# Patient Record
Sex: Male | Born: 1935 | Race: White | Hispanic: No | Smoking: Light tobacco smoker
Health system: Southern US, Community
[De-identification: ages and names within clinical notes are randomized; demographics above are authoritative.]

## PROBLEM LIST (undated history)

## (undated) DIAGNOSIS — I10 Essential (primary) hypertension: Secondary | ICD-10-CM

## (undated) DIAGNOSIS — R42 Dizziness and giddiness: Secondary | ICD-10-CM

## (undated) DIAGNOSIS — IMO0001 Reserved for inherently not codable concepts without codable children: Secondary | ICD-10-CM

## (undated) DIAGNOSIS — H409 Unspecified glaucoma: Secondary | ICD-10-CM

## (undated) DIAGNOSIS — I82409 Acute embolism and thrombosis of unspecified deep veins of unspecified lower extremity: Secondary | ICD-10-CM

## (undated) DIAGNOSIS — H9190 Unspecified hearing loss, unspecified ear: Secondary | ICD-10-CM

## (undated) DIAGNOSIS — J189 Pneumonia, unspecified organism: Secondary | ICD-10-CM

## (undated) DIAGNOSIS — C801 Malignant (primary) neoplasm, unspecified: Secondary | ICD-10-CM

## (undated) DIAGNOSIS — F329 Major depressive disorder, single episode, unspecified: Secondary | ICD-10-CM

## (undated) DIAGNOSIS — F32A Depression, unspecified: Secondary | ICD-10-CM

## (undated) DIAGNOSIS — M2550 Pain in unspecified joint: Secondary | ICD-10-CM

## (undated) DIAGNOSIS — E785 Hyperlipidemia, unspecified: Secondary | ICD-10-CM

## (undated) DIAGNOSIS — J42 Unspecified chronic bronchitis: Secondary | ICD-10-CM

## (undated) DIAGNOSIS — E119 Type 2 diabetes mellitus without complications: Secondary | ICD-10-CM

## (undated) DIAGNOSIS — M549 Dorsalgia, unspecified: Secondary | ICD-10-CM

## (undated) DIAGNOSIS — K219 Gastro-esophageal reflux disease without esophagitis: Secondary | ICD-10-CM

## (undated) DIAGNOSIS — M1990 Unspecified osteoarthritis, unspecified site: Secondary | ICD-10-CM

## (undated) DIAGNOSIS — J449 Chronic obstructive pulmonary disease, unspecified: Secondary | ICD-10-CM

## (undated) HISTORY — PX: OTHER SURGICAL HISTORY: SHX169

## (undated) HISTORY — PX: ANKLE SURGERY: SHX546

## (undated) HISTORY — PX: HEMORRHOID SURGERY: SHX153

## (undated) HISTORY — PX: EYE SURGERY: SHX253

## (undated) HISTORY — PX: INNER EAR SURGERY: SHX679

## (undated) HISTORY — PX: CATARACT EXTRACTION, BILATERAL: SHX1313

## (undated) HISTORY — PX: HERNIA REPAIR: SHX51

---

## 2001-04-15 ENCOUNTER — Encounter: Payer: Self-pay | Admitting: Otolaryngology

## 2001-04-15 ENCOUNTER — Encounter: Admission: RE | Admit: 2001-04-15 | Discharge: 2001-04-15 | Payer: Self-pay | Admitting: Otolaryngology

## 2001-06-27 ENCOUNTER — Encounter: Payer: Self-pay | Admitting: Internal Medicine

## 2001-06-27 ENCOUNTER — Encounter: Admission: RE | Admit: 2001-06-27 | Discharge: 2001-06-27 | Payer: Self-pay | Admitting: Internal Medicine

## 2003-04-13 ENCOUNTER — Encounter: Admission: RE | Admit: 2003-04-13 | Discharge: 2003-04-13 | Payer: Self-pay | Admitting: Otolaryngology

## 2003-10-04 ENCOUNTER — Ambulatory Visit (HOSPITAL_COMMUNITY): Admission: RE | Admit: 2003-10-04 | Discharge: 2003-10-04 | Payer: Self-pay | Admitting: Otolaryngology

## 2003-10-04 ENCOUNTER — Ambulatory Visit (HOSPITAL_BASED_OUTPATIENT_CLINIC_OR_DEPARTMENT_OTHER): Admission: RE | Admit: 2003-10-04 | Discharge: 2003-10-04 | Payer: Self-pay | Admitting: Otolaryngology

## 2003-10-04 ENCOUNTER — Encounter (INDEPENDENT_AMBULATORY_CARE_PROVIDER_SITE_OTHER): Payer: Self-pay | Admitting: Specialist

## 2004-11-08 ENCOUNTER — Ambulatory Visit (HOSPITAL_COMMUNITY): Admission: RE | Admit: 2004-11-08 | Discharge: 2004-11-08 | Payer: Self-pay | Admitting: Gastroenterology

## 2005-12-03 ENCOUNTER — Ambulatory Visit (HOSPITAL_COMMUNITY): Admission: RE | Admit: 2005-12-03 | Discharge: 2005-12-03 | Payer: Self-pay | Admitting: Urology

## 2005-12-06 ENCOUNTER — Ambulatory Visit: Admission: RE | Admit: 2005-12-06 | Discharge: 2005-12-30 | Payer: Self-pay | Admitting: Radiation Oncology

## 2006-02-08 ENCOUNTER — Ambulatory Visit: Admission: RE | Admit: 2006-02-08 | Discharge: 2006-05-09 | Payer: Self-pay | Admitting: Radiation Oncology

## 2006-03-18 ENCOUNTER — Encounter: Admission: RE | Admit: 2006-03-18 | Discharge: 2006-03-18 | Payer: Self-pay | Admitting: Urology

## 2006-03-25 ENCOUNTER — Ambulatory Visit (HOSPITAL_BASED_OUTPATIENT_CLINIC_OR_DEPARTMENT_OTHER): Admission: RE | Admit: 2006-03-25 | Discharge: 2006-03-25 | Payer: Self-pay | Admitting: Urology

## 2009-02-28 ENCOUNTER — Encounter: Admission: RE | Admit: 2009-02-28 | Discharge: 2009-02-28 | Payer: Self-pay | Admitting: Otolaryngology

## 2009-06-30 ENCOUNTER — Emergency Department (HOSPITAL_COMMUNITY): Admission: EM | Admit: 2009-06-30 | Discharge: 2009-07-01 | Payer: Self-pay | Admitting: Emergency Medicine

## 2010-07-02 LAB — POCT I-STAT, CHEM 8
BUN: 16 mg/dL (ref 6–23)
Calcium, Ion: 1.12 mmol/L (ref 1.12–1.32)
Creatinine, Ser: 1.1 mg/dL (ref 0.4–1.5)
Glucose, Bld: 573 mg/dL (ref 70–99)
Sodium: 131 mEq/L — ABNORMAL LOW (ref 135–145)

## 2010-07-02 LAB — GLUCOSE, CAPILLARY
Glucose-Capillary: 113 mg/dL — ABNORMAL HIGH (ref 70–99)
Glucose-Capillary: 116 mg/dL — ABNORMAL HIGH (ref 70–99)
Glucose-Capillary: 318 mg/dL — ABNORMAL HIGH (ref 70–99)
Glucose-Capillary: 426 mg/dL — ABNORMAL HIGH (ref 70–99)
Glucose-Capillary: 98 mg/dL (ref 70–99)
Glucose-Capillary: >600 mg/dL (ref 70–99)

## 2010-07-02 LAB — DIFFERENTIAL
Basophils Absolute: 0 10*3/uL (ref 0.0–0.1)
Basophils Relative: 0 % (ref 0–1)
Eosinophils Absolute: 0.1 10*3/uL (ref 0.0–0.7)
Lymphocytes Relative: 24 % (ref 12–46)
Lymphs Abs: 1.4 10*3/uL (ref 0.7–4.0)
Monocytes Relative: 7 % (ref 3–12)
Neutro Abs: 4.1 10*3/uL (ref 1.7–7.7)
Neutrophils Relative %: 68 % (ref 43–77)

## 2010-07-02 LAB — URINALYSIS, ROUTINE W REFLEX MICROSCOPIC
Hgb urine dipstick: NEGATIVE
Ketones, ur: 15 mg/dL — AB
Leukocytes, UA: NEGATIVE
Nitrite: NEGATIVE
Protein, ur: NEGATIVE mg/dL
Specific Gravity, Urine: 1.035 — ABNORMAL HIGH (ref 1.005–1.030)
pH: 6.5 (ref 5.0–8.0)

## 2010-07-02 LAB — POCT I-STAT 3, VENOUS BLOOD GAS (G3P V)
O2 Saturation: 99 %
pO2, Ven: 154 mmHg — ABNORMAL HIGH (ref 30.0–45.0)

## 2010-07-02 LAB — CBC
Hemoglobin: 14.7 g/dL (ref 13.0–17.0)
MCHC: 34.5 g/dL (ref 30.0–36.0)
MCV: 89.5 fL (ref 78.0–100.0)
Platelets: 141 10*3/uL — ABNORMAL LOW (ref 150–400)
RDW: 12.4 % (ref 11.5–15.5)

## 2010-07-02 LAB — BASIC METABOLIC PANEL
BUN: 15 mg/dL (ref 6–23)
CO2: 29 mEq/L (ref 19–32)
Chloride: 100 mEq/L (ref 96–112)
Creatinine, Ser: 0.69 mg/dL (ref 0.4–1.5)
Creatinine, Ser: 1.04 mg/dL (ref 0.4–1.5)
GFR calc non Af Amer: >60 mL/min (ref >60)
Glucose, Bld: 164 mg/dL — ABNORMAL HIGH (ref 70–99)
Potassium: 3.6 mEq/L (ref 3.5–5.1)
Sodium: 134 mEq/L — ABNORMAL LOW (ref 135–145)

## 2010-07-02 LAB — URINE MICROSCOPIC-ADD ON

## 2010-08-25 NOTE — Op Note (Signed)
NAMEDARRIL, PATRIARCA                          ACCOUNT NO.:  [id number]   MEDICAL RECORD NO.:  [id number]                   PATIENT TYPE:  AMB   LOCATION:  DSC                                  FACILITY:  [facility]   PHYSICIAN:  Karol T. Lazarus Salines, M.D.              DATE OF BIRTH:  1934/09/11   DATE OF PROCEDURE:  10/04/2003  DATE OF DISCHARGE:                                 OPERATIVE REPORT   PREOPERATIVE DIAGNOSIS:  Right attic and mastoid cholesteatoma.   POSTOPERATIVE DIAGNOSIS:  Right attic and mastoid cholesteatoma.   OPERATION PERFORMED:  Right tympanoplasty with mastoidectomy, canal wall up,  and PORP ossicular reconstruction.  Myringotomy with tube.   SURGEON:  Gloris Manchester. Lazarus Salines, M.D.   ANESTHESIA:  General orotracheal anesthesia.   ESTIMATED BLOOD LOSS:  Minimal.   COMPLICATIONS:  None.   OPERATIVE FINDINGS:  A bulky relatively cleanly dissecting cholesteatoma  with a large mouth in the posterior pars flaccida of the right tympanic  membrane filling the attic including erosion of the heads of the malleus and  incus and the short processes of the incus all absent.  Extending back  through the atticus into antrum of the mastoid.  Obstructive chronic  mastoiditis with sclerotic bone but a relatively moderate to large mastoid  system.  Otherwise intact pars tensa of the tympanic membrane and healthy  aerated middle ear mucosa.  Intact long process of the malleus and long  process of the incus.  A mobile stapes superstructure.  Chorda tympani  intact.   DESCRIPTION OF PROCEDURE:  With the patient in a comfortable supine  position, general orotracheal anesthesia was induced without difficulty.  At  an appropriate level, the table was turned 90 degrees, and the patient  placed in a slightly reversed Trendelenburg position.  1% Xylocaine with  1:100,000 epinephrine was infiltrated into the postauricular crease, into  the vascular strip from posteriorly and into the temporalis  fascia harvest  site for intraoperative hemostasis.  Several minutes were allowed for these  to take effect.  A sterile preparation and draping of the right ear was  accomplished.   Using a microscope and speculum, the right ear canal was cleaned and the  findings as above were identified.  Four quadrant injections of 2% Xylocaine  with 1:50,000 epinephrine were performed, 2 mL total again for  intraoperative hemostasis.  Attention was returned to the postauricular area  where a complete mastoidectomy type incision staying above the auricular  crease to avoid trauma from wearing glasses was performed and carried down  through skin and subcutaneous fat using the sharp knife on the skin and  cutting and coagulating cautery beneath the skin.  The temporalis fascia was  identified and cleaned on its lateral surface.  The inferior edge was  excised and the medial surface was cleaned.  A roughly 2 x 3 cm piece of  temporalis fascia was harvested, cleaned,  pressed and placed to dry.  Hemosasis was spontaneous.  At this point an incision was made through the  periosteum at the linea temporalis and down the mastoid tip.  The periosteum  was elevated in all directions with a Lepird elevator.   Returning to the ear canal, the findings were as described above.  6 and 12  o'clock incisions were made starting at the annulus and working directly  outward using a sickle knife and these were connected around the posterior  annulus and the atticotomy defect using a large Rosen round knife.  The flap  was window shaded upwards for a slight distance.  The annulus was elevated  and the middle ear space was entered in the posterior inferior quadrant and  this was dissected up towards the attic.  Upon identifying the mouth of the  cholesteatoma sac, Bellucci scissors were used to trim the tympanic membrane  from posteriorly up to the short process of the malleus.  The tympanic  membrane was elevated off the  short process and carried forward with the  Bellucci scissors to the anterior aspect of the mouth of the cholesteatoma.  Attention was now returned to the postauricular area once again.  The flaps  were elevated farther forward with the use of self-retaining retractors and  the dissection was carried down the ear canal where the vascular strip was  carried forward with a Perkins retractor.  The cotton ball was placed in the  medial canal to protect from bone dust.  Using a large thoroughly irrigated  cutting bur, the incisions in the mastoid bone were begun along the linea  temporalis, the posterior canal and the anticipated site of the sigmoid  sinus.  The dissection was carried down taking care to saucerize the mastoid  carefully along all three of these landmarks.  The sigmoid sinus was  identified translucently through the bone identifying the so-called hard  angle.  The dura was also identified but not opened.  The dissection was  carried down in stages to the mastoid tip along the posterior canal and down  and finally large air cells in the mastoid tip were identified and  communicated upward and the antrum was identified.  The cholesteatoma sac  basically filled the antrum.  The bone was quite dense and there were  numerous so-called chicken fat containing air cells.  Upon identifying the  cholesteatoma sac it was gradually dissected and the bone overlying was  further removed. This was done in an iterative fashion working down the  zygomatic root, taking care to maintain the integrity of the posterior canal  wall working forward around the sigmoid sinus and down around the dural  plate.  The sac was opened and decompressed leaving the matrix intact for  better dissection.  The dissection was carried forward up into the attic.  The body and short process of the incus were noted to be completely absent.  The long process of the incus was intact but eroded at its base.  The head of the  malleus was also eroded and basically absent.  The dissection was  carried forward along the attic. The remainder of the malleus head was  removed with a malleus nipper but the tensor tympani was allowed to remain  intact.   Working back to the external canal, a small amount of dissection with drill  and curets along the annulus allowed better access to the incudostapedial  joint.  Using a tab knife, and working anteriorly along  the direction of the  stapedius tendon, the joint was disarticulated.  At this point the incus  handle was removed separate from the main specimen.  Working back to the  attic from [employer], the cholesteatoma sac was dissected free of the  anterior attic and brought back down to the pars flaccida and dissected away  from the neck of the malleus.  The chorda tympani nerve was intact.  The  cholesteatoma was felt to have been completely evacuated.  At this point the  somewhat soft bone and several remaining so-called chicken fat cells of the  mastoid tip and along the sigmoid sinus were gently debrided.  The mastoid  cavity was thoroughly saucerized.  The region of the facial nerve had been  dissected carefully but the nerve was not at any point identified  translucent through the bone.  There was no evidence of a dehiscence of the  facial nerve in the horizontal section or the second genu nor any exposure  of the horizontal semicircular canal.  At this point the middle ear space  was intact.  The pars tensa was intact.  The malleus handle was intact and  remained attached by the tensor tympani tendon.  Given a relative competence  about removal of the cholesteatoma, it was felt appropriate to attempt a  reconstruction.   Before attempting reconstruction, an anterior inferior radial myringotomy  incision was sharply executed and a modified T-tube was placed without  difficulty.  The fascia was brought off the press and fashioned with a tong  to reach up  under the malleus long handle and apart to rotate around to  completely cover the pars flaccida defect.  This was easily inserted with a  notch for the chorda tympani and draped up across the posterior canal wall.  The drum remnant was laid on top of this.  Then the whole thing was elevated  forward for access to the stapes superstructure.  The superstructure was  quite mobile and I could not completely determine whether it had possibly  been fractured although not out of position.   A Xomed PORP prosthesis with a notch for the malleus handle and a Plastipore  shaft was chosen.  This was placed over the stapes superstructure and  observed to be too tall.  It was shortened approximately 2 mm and placed  once again.  It had a rather strong anterior angulation but did connect from  the malleus long handle to the stapes superstructure and it was allowed to  remain in this position.  It was supported with small bits of Ciprodex impregnated Gelfoam.  The fascia plus drum composite was laid back down into  the annulus and up the posterior canal wall.  There was no residual  perforation noted.   Note that before attempting the tympanoplasty and ossicular reconstruction,  the entire field was thoroughly irrigated of bone chips.  The cotton ball  which had been placed in the external canal was removed sometime prior.  At  this point hemostasis was observed.  Satisfactory reconstruction of the drum  and conducting mechanism was felt to have been accomplished and the  cholesteatoma was felt to have been completely resected.  The mastoid cavity  was suctioned free one more time of a small amount of blood.  The self-  retaining retractors were removed and the postauricular wound was closed  with interrupted buried 3-0 chromic sutures.  The posterior wound was  finished with benzoin and Steri-Strips.   Working  back down to the external canal using the microscope, the vascular  slip was laid back down  on top of the temporalis fascia graft.  The canal  was filled with Ciprodex impregnated Gelfoam.  A cotton ball was wedged  tightly into the external meatus and a second cotton ball was placed in the  conchal bowl.  A standard fluff and cling dressing was applied.  At this  point the procedure was completed.  The patient was returned to anesthesia,  awakened, extubated and transferred to recovery in stable condition.   COMMENT:  A 76 year old white male with a several year history of draining  ear and identification of attic cholesteatoma.  The patient was reluctant to  undergo surgery for probably two years but now has come to surgery, given  some recurrent infections and diminishing hearing.  Anticipate a routine  postoperative recovery with attention to ice, elevation, analgesia,  antibiosis.  Will remove the mastoid dressing in two days and will remove  the cotton ball and begin eardrops in one week.  Will caution him about  water exposure and nose blowing.  Given low anticipated risks of post  anesthetic or post surgical complications,  I feel an outpatient venue is  appropriate.                                               Gloris Manchester. Lazarus Salines, M.D.    KTW/MEDQ  D:  10/04/2003  T:  10/04/2003  Job:  [id number]   cc:   Theressa Millard, M.D.  [address] [place]  [place] [address]  Fax: [phone]

## 2010-08-25 NOTE — Op Note (Signed)
[name], [name]                ACCOUNT NO.:  [id number]   MEDICAL RECORD NO.:  [id number]          PATIENT TYPE:  AMB   LOCATION:  ENDO                         FACILITY[facility]  [facility]   PHYSICIAN:  Danise Edge, M.D.   DATE OF BIRTH:  01/19/1935   DATE OF PROCEDURE:  11/08/2004  DATE OF DISCHARGE:                                 OPERATIVE REPORT   PROCEDURE:  Colonoscopy and polypectomy.   REFERRING PHYSICIAN:  Dr. Theressa Millard.   INDICATIONS FOR PROCEDURE:  Thomas Mcconnell is a 76 year old male born  January 25, 1935. Thomas Mcconnell is scheduled to undergo his first screening  colonoscopy with polypectomy to prevent colon cancer.   ENDOSCOPIST:  Danise Edge, M.D.   PREMEDICATION:  Versed 5 mg, Demerol 60 mg.   DESCRIPTION OF PROCEDURE:  After obtaining informed consent, Thomas Mcconnell was  placed in the left lateral decubitus position. I administered intravenous  Demerol and intravenous Versed to achieve conscious sedation for the  procedure. The patient's blood pressure, oxygen saturation and cardiac  rhythm were monitored throughout the procedure and documented in the medical  record.   Anal inspection and digital rectal exam were normal. The prostate was  nonnodular. The Olympus adjustable pediatric colonoscope was introduced into  the rectum and easily advanced to the cecum. Colonic preparation for the  exam today was excellent.   RECTUM:  Normal. Retroflex view of the distal rectum normal.  SIGMOID COLON AND DESCENDING COLON:  From the distal sigmoid colon at 20 cm  from the anal verge, a 2 mm sessile polyp was removed with the  electrocautery snare. Pathology was not retrieved. Left colonic  diverticulosis present.  SPLENIC FLEXURE:  Normal.  TRANSVERSE COLON:  Normal.  HEPATIC FLEXURE:  Normal.  ASCENDING COLON:  Normal.  CECUM AND ILEOCECAL VALVE:  Normal.   ASSESSMENT:  1.  From the distal sigmoid colon at 20 cm from the anal  verge, a 2 mm sessile polyp was  removed with the electrocautery snare. No  pathology was retrieved.  1.  Left colonic diverticulosis.   RECOMMENDATIONS:  Thomas Mcconnell should undergo a repeat colonoscopy in five  years.       MJ/MEDQ  D:  11/08/2004  T:  11/08/2004  Job:  [id number]   cc:   Theressa Millard, M.D.  [address] [place]  [place] [address]  Fax: [phone]

## 2010-08-25 NOTE — Op Note (Signed)
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 Biopsy performed November 14, 2004 showed a  Gleason 6-7  adenocarcinoma involving 5-10% of the tissue on the left  side. PSA prior to biopsy 4.05. Given the wide variety of options,  watchful waiting, hormonal therapy, radiation therapy and surgical  prostatectomy, the patient elected radiation therapy which we felt was  an appropriate or acceptable choice given his age, 29 years, and the  relatively low volume of the cancer. Bone scan and CT scan showed no  obvious evidence of metastatic disease.  See notes from Chipper Herb,  MD.   POSTOPERATIVE DIAGNOSIS:  Same.   PROCEDURE:  Transperineal placement of I-125 radioactive seeds.   SURGEONS:  1. Boston Service, MD.  2. Maryln Gottron, MD.   ANESTHESIA:  General.   DRAINS:  18-French Foley.   SPECIMENS:  None.   COMPLICATIONS:  None obvious.   DESCRIPTION OF PROCEDURE:  The patient was prepped and draped in the  dorsal lithotomy position after institution of an adequate level of  general anesthesia. A transrectal ultrasound probe, rectal tube,  suprapubic fluoroscopy unit and indwelling Foley catheter were  positioned. Duplicate transverse and longitudinal scans of the prostate  were obtained in hopes of duplicating pictures obtained preoperatively.  Once appropriate imaging had been obtained, transverse and longitudinal  outlines of the prostate were established using the Nucletron  guidelines. The plan was then instituted, seeds passed at the grid  according to prearranged coordinates. Total  number of needles 24, total  number of seeds 54.  Activity per seed 27.918 mCi, __________  isotope I-  125 all seeds appeared to pass easily in good position both by  ultrasound and fluoroscopic guidance. Once all seeds had been placed,  the needles were withdrawn.  Appropriate suprapubic fluoroscopic images  were obtained.  Indwelling Foley catheter was removed.  Fiberoptic  cystoscopy showed a normal urethra and sphincter, mild hyperemia of the  prostatic mucosa but no evidence of perforation or protruding seeds.  Normal trigonal anatomy, clear efflux right and left orifice.  No  evidence of bleeding within the bladder.  No evidence of puncture or  seed. The bladder was filled to capacity,  cystoscope was withdrawn, 18-French Foley was inserted and left to  straight drain.  The patient was then returned to recovery in  satisfactory condition having tolerated his fiberoptic cystoscopy and  transperineal needle placement of I-125 seeds.           ______________________________  Boston Service, M.D.     RH/MEDQ  D:  03/25/2006  T:  03/25/2006  Job:  [id number]   cc:   Theressa Millard, M.D.  Fax: [phone]   Maryln Gottron,  M.D.  Fax: [phone]

## 2014-09-15 ENCOUNTER — Other Ambulatory Visit: Payer: Self-pay | Admitting: Gastroenterology

## 2014-12-15 ENCOUNTER — Other Ambulatory Visit: Payer: Self-pay | Admitting: Gastroenterology

## 2014-12-16 ENCOUNTER — Encounter (HOSPITAL_COMMUNITY): Payer: Self-pay | Admitting: *Deleted

## 2014-12-28 ENCOUNTER — Ambulatory Visit (HOSPITAL_COMMUNITY): Payer: Medicare Other | Admitting: Anesthesiology

## 2014-12-28 ENCOUNTER — Ambulatory Visit (HOSPITAL_COMMUNITY)
Admission: RE | Admit: 2014-12-28 | Discharge: 2014-12-28 | Disposition: A | Discharge status: Home / Self Care | Payer: Medicare Other | Source: Ambulatory Visit | Attending: Gastroenterology | Admitting: Gastroenterology

## 2014-12-28 ENCOUNTER — Encounter (HOSPITAL_COMMUNITY)
Admission: RE | Disposition: A | Discharge status: Home / Self Care | Payer: Self-pay | Source: Ambulatory Visit | Attending: Gastroenterology

## 2014-12-28 ENCOUNTER — Encounter (HOSPITAL_COMMUNITY): Payer: Self-pay | Admitting: *Deleted

## 2014-12-28 DIAGNOSIS — E119 Type 2 diabetes mellitus without complications: Secondary | ICD-10-CM | POA: Diagnosis not present

## 2014-12-28 DIAGNOSIS — K5730 Diverticulosis of large intestine without perforation or abscess without bleeding: Secondary | ICD-10-CM | POA: Diagnosis not present

## 2014-12-28 DIAGNOSIS — J449 Chronic obstructive pulmonary disease, unspecified: Secondary | ICD-10-CM | POA: Diagnosis not present

## 2014-12-28 DIAGNOSIS — I10 Essential (primary) hypertension: Secondary | ICD-10-CM | POA: Diagnosis not present

## 2014-12-28 DIAGNOSIS — Z86711 Personal history of pulmonary embolism: Secondary | ICD-10-CM | POA: Insufficient documentation

## 2014-12-28 DIAGNOSIS — F17200 Nicotine dependence, unspecified, uncomplicated: Secondary | ICD-10-CM | POA: Diagnosis not present

## 2014-12-28 DIAGNOSIS — Z1211 Encounter for screening for malignant neoplasm of colon: Secondary | ICD-10-CM | POA: Insufficient documentation

## 2014-12-28 DIAGNOSIS — D122 Benign neoplasm of ascending colon: Secondary | ICD-10-CM | POA: Insufficient documentation

## 2014-12-28 DIAGNOSIS — Z86010 Personal history of colonic polyps: Secondary | ICD-10-CM | POA: Insufficient documentation

## 2014-12-28 HISTORY — DX: Major depressive disorder, single episode, unspecified: F32.9

## 2014-12-28 HISTORY — DX: Malignant (primary) neoplasm, unspecified: C80.1

## 2014-12-28 HISTORY — DX: Gastro-esophageal reflux disease without esophagitis: K21.9

## 2014-12-28 HISTORY — DX: Type 2 diabetes mellitus without complications: E11.9

## 2014-12-28 HISTORY — DX: Reserved for inherently not codable concepts without codable children: IMO0001

## 2014-12-28 HISTORY — DX: Essential (primary) hypertension: I10

## 2014-12-28 HISTORY — DX: Unspecified hearing loss, unspecified ear: H91.90

## 2014-12-28 HISTORY — DX: Acute embolism and thrombosis of unspecified deep veins of unspecified lower extremity: I82.409

## 2014-12-28 HISTORY — DX: Chronic obstructive pulmonary disease, unspecified: J44.9

## 2014-12-28 HISTORY — DX: Depression, unspecified: F32.A

## 2014-12-28 HISTORY — PX: COLONOSCOPY WITH PROPOFOL: SHX5780

## 2014-12-28 HISTORY — DX: Unspecified glaucoma: H40.9

## 2014-12-28 LAB — GLUCOSE, CAPILLARY: GLUCOSE-CAPILLARY: 129 mg/dL — AB (ref 65–99)

## 2014-12-28 SURGERY — COLONOSCOPY WITH PROPOFOL
Anesthesia: Monitor Anesthesia Care

## 2014-12-28 MED ORDER — PROPOFOL 10 MG/ML IV BOLUS
INTRAVENOUS | Status: DC | PRN
  Administered 2014-12-28: 20 mg via INTRAVENOUS
  Administered 2014-12-28 (×3): 40 mg via INTRAVENOUS
  Administered 2014-12-28: 20 mg via INTRAVENOUS
  Administered 2014-12-28: 40 mg via INTRAVENOUS

## 2014-12-28 MED ORDER — SODIUM CHLORIDE 0.9 % IV SOLN: INTRAVENOUS | Status: DC

## 2014-12-28 MED ORDER — PROPOFOL 10 MG/ML IV BOLUS
INTRAVENOUS | Status: AC
  Filled 2014-12-28: qty 20

## 2014-12-28 MED ORDER — LACTATED RINGERS IV SOLN
INTRAVENOUS | Status: DC
  Administered 2014-12-28: 1000 mL via INTRAVENOUS

## 2014-12-28 SURGICAL SUPPLY — 21 items

## 2014-12-28 NOTE — Discharge Instructions (Signed)

## 2014-12-28 NOTE — Op Note (Signed)
Procedure: Surveillance colonoscopy. 01/17/2010 normal surveillance colonoscopy performed. In 2006, colonoscopy performed with removal of a small adenomatous colon polyp  Endoscopist: Earle Gell  Premedication: Propofol administered by anesthesia  Procedure: The patient was placed in the left lateral decubitus position. Anal inspection and digital rectal exam were normal. The patient has undergone prostate seed implants to treat prostate cancer. The Pentax pediatric colonoscope was introduced into the rectum and advanced to the cecum. A normal-appearing ileocecal valve and appendiceal orifice were identified. Colonic preparation for the exam today was satisfactory. Withdrawal time was 12 minutes  Rectum. Normal. Retroflexed view of the distal rectum was normal  Sigmoid colon and descending colon. Left colonic diverticulosis  Splenic flexure. Normal  Transverse colon. Normal  Hepatic flexure. Normal  Ascending colon. From the proximal ascending colon, a 5 mm sessile polyp was removed with the cold snare  Cecum and ileocecal valve. Normal.  Assessment: A small polyp was removed from the proximal ascending colon. Otherwise normal surveillance colonoscopy.  Recommendation: I do not recommend performing a repeat surveillance colonoscopy.

## 2014-12-28 NOTE — H&P (Signed)
  Procedure: Surveillance colonoscopy. 01/17/2010 normal surveillance colonoscopy performed. In 2006 colonoscopy performed with removal of a small adenomatous colon polyp  History: The patient is a 80 year old male born [birth year]. He is scheduled to undergo a surveillance colonoscopy today.  Past medical history: Knee surgery. Hernia repair surgery. Achilles tendon repair. Hemorrhoidectomy. Ear surgery for cholesteatoma. Laser surgery for glaucoma. Bilateral cataract surgery. Seed implant into the prostate for prostate cancer. Pulmonary embolism after Achilles tendon repair. Chronic obstructive pulmonary disease. Hypertension. Type 2 diabetes mellitus. Major depressive disorder.  Exam: The patient is alert and lying comfortably on the endoscopy stretcher. Abdomen is soft and nontender to palpation. Lungs are clear to auscultation. Cardiac exam reveals a regular rhythm.  Plan: Proceed with surveillance colonoscopy

## 2014-12-28 NOTE — Transfer of Care (Signed)
Immediate Anesthesia Transfer of Care Note  Patient: Thomas Mcconnell  Procedure(s) Performed: Procedure(s): COLONOSCOPY WITH PROPOFOL (N/A)  Patient Location: PACU and Endoscopy Unit  Anesthesia Type:MAC  Level of Consciousness: awake, oriented, patient cooperative, lethargic and responds to stimulation  Airway & Oxygen Therapy: Patient Spontanous Breathing and Patient connected to face mask oxygen  Post-op Assessment: Report given to RN, Post -op Vital signs reviewed and stable and Patient moving all extremities  Post vital signs: Reviewed and stable  Last Vitals:  Filed Vitals:   12/28/14 0841  BP: 158/79  Temp: 36.5 C  Resp: 13    Complications: No apparent anesthesia complications

## 2014-12-28 NOTE — Anesthesia Postprocedure Evaluation (Signed)
  Anesthesia Post-op Note  Patient: Thomas Mcconnell  Procedure(s) Performed: Procedure(s) (LRB): COLONOSCOPY WITH PROPOFOL (N/A)  Patient Location: PACU  Anesthesia Type: MAC  Level of Consciousness: awake and alert   Airway and Oxygen Therapy: Patient Spontanous Breathing  Post-op Pain: mild  Post-op Assessment: Post-op Vital signs reviewed, Patient's Cardiovascular Status Stable, Respiratory Function Stable, Patent Airway and No signs of Nausea or vomiting  Last Vitals:  Filed Vitals:   12/28/14 1020  BP: 137/57  Pulse: 52  Temp:   Resp: 14    Post-op Vital Signs: stable   Complications: No apparent anesthesia complications

## 2014-12-28 NOTE — Anesthesia Preprocedure Evaluation (Addendum)
Anesthesia Evaluation  Patient identified by MRN, date of birth, ID band Patient awake    Reviewed: Allergy & Precautions, H&P , NPO status , Patient's Chart, lab work & pertinent test results  Airway Mallampati: II  TM Distance: >3 FB Neck ROM: full    Dental  (+) Dental Advisory Given, Edentulous Upper, Edentulous Lower   Pulmonary COPD,  COPD inhaler, Current Smoker,    Pulmonary exam normal breath sounds clear to auscultation       Cardiovascular Exercise Tolerance: Good hypertension, Pt. on medications Normal cardiovascular exam Rhythm:regular Rate:Normal     Neuro/Psych negative neurological ROS  negative psych ROS   GI/Hepatic negative GI ROS, Neg liver ROS,   Endo/Other  diabetes, Well Controlled, Type 2, Oral Hypoglycemic Agents  Renal/GU negative Renal ROS  negative genitourinary   Musculoskeletal   Abdominal   Peds  Hematology negative hematology ROS (+)   Anesthesia Other Findings   Reproductive/Obstetrics negative OB ROS                            Anesthesia Physical Anesthesia Plan  ASA: III  Anesthesia Plan: MAC   Post-op Pain Management:    Induction:   Airway Management Planned:   Additional Equipment:   Intra-op Plan:   Post-operative Plan:   Informed Consent: I have reviewed the patients History and Physical, chart, labs and discussed the procedure including the risks, benefits and alternatives for the proposed anesthesia with the patient or authorized representative who has indicated his/her understanding and acceptance.   Dental Advisory Given  Plan Discussed with: CRNA and Surgeon  Anesthesia Plan Comments:         Anesthesia Quick Evaluation

## 2015-01-21 ENCOUNTER — Other Ambulatory Visit: Payer: Self-pay | Admitting: Internal Medicine

## 2015-01-21 DIAGNOSIS — R0989 Other specified symptoms and signs involving the circulatory and respiratory systems: Secondary | ICD-10-CM

## 2015-01-25 ENCOUNTER — Other Ambulatory Visit: Payer: Medicare Other

## 2015-01-27 ENCOUNTER — Ambulatory Visit
Admission: RE | Admit: 2015-01-27 | Discharge: 2015-01-27 | Disposition: A | Discharge status: Home / Self Care | Payer: Medicare Other | Source: Ambulatory Visit | Attending: Internal Medicine | Admitting: Internal Medicine

## 2015-01-27 DIAGNOSIS — R0989 Other specified symptoms and signs involving the circulatory and respiratory systems: Secondary | ICD-10-CM

## 2015-02-04 ENCOUNTER — Encounter: Payer: Self-pay | Admitting: Vascular Surgery

## 2015-02-04 ENCOUNTER — Other Ambulatory Visit: Payer: Self-pay | Admitting: *Deleted

## 2015-02-04 DIAGNOSIS — I739 Peripheral vascular disease, unspecified: Secondary | ICD-10-CM

## 2015-03-11 ENCOUNTER — Encounter: Payer: Self-pay | Admitting: Vascular Surgery

## 2015-03-16 ENCOUNTER — Encounter: Payer: Self-pay | Admitting: Vascular Surgery

## 2015-03-16 ENCOUNTER — Ambulatory Visit (HOSPITAL_COMMUNITY)
Admission: RE | Admit: 2015-03-16 | Discharge: 2015-03-16 | Disposition: A | Discharge status: Home / Self Care | Payer: Medicare Other | Source: Ambulatory Visit | Attending: Vascular Surgery | Admitting: Vascular Surgery

## 2015-03-16 ENCOUNTER — Encounter (HOSPITAL_COMMUNITY): Payer: Medicare Other

## 2015-03-16 ENCOUNTER — Ambulatory Visit (INDEPENDENT_AMBULATORY_CARE_PROVIDER_SITE_OTHER): Payer: Medicare Other | Admitting: Vascular Surgery

## 2015-03-16 VITALS — BP 117/72 | HR 69 | Temp 98.0°F | Resp 16 | Ht 71.0 in | Wt 198.0 lb

## 2015-03-16 DIAGNOSIS — I7409 Other arterial embolism and thrombosis of abdominal aorta: Secondary | ICD-10-CM

## 2015-03-16 DIAGNOSIS — I739 Peripheral vascular disease, unspecified: Secondary | ICD-10-CM

## 2015-03-16 NOTE — Progress Notes (Signed)
[facility] [place]  Patient name: Thomas Mcconnell MRN: [id number] DOB: 1935-02-17 Sex: male  REASON FOR CONSULT: Peripheral vascular disease. Referred by Dr. Kelton Pillar.  HPI: Thomas Mcconnell is a 80 y.o. male, who is referred for evaluation of iliac artery occlusive disease. He has had some pain in the lateral aspect of his right thigh and also the right leg to some extent over the last several months. This is brought on by ambulation and relieved with rest. He denies any history of rest pain or nonhealing ulcers. He also has some low back pain and the fact that the pain is in the lateral aspect of his thigh suggest this could potentially be referred pain from the back.  He underwent an arterial Doppler study which suggested iliac artery occlusive disease on the right and he was sent for vascular consultation.  His risk factors for peripheral vascular disease include diabetes, hypertension, hypercholesterolemia, a family history of premature cardiovascular disease, and tobacco use. He smokes 3 cigars a day.  Past Medical History  Diagnosis Date  . Hypertension   . DVT (deep venous thrombosis) ([place])     s/p ankle surgery  . Bronchitis   . Diabetes mellitus without complication ([place])     oral meds only  . Depression   . GERD (gastroesophageal reflux disease)   . Cancer [facility])     Prostate cancer"seed implant" 10 years ago  . Glaucoma   . COPD (chronic obstructive pulmonary disease) ([place])     noted CXR 2011 Epic  . Hearing impaired     hearing aids bilaterally    History reviewed. No pertinent family history. His brother had a heart attack at age 61.  SOCIAL HISTORY: Social History   Social History  . Marital Status: Widowed    Spouse Name: N/A  . Number of Children: N/A  . Years of Education: N/A   Occupational History  . Not on file.   Social History Main Topics  . Smoking status: Light Tobacco Smoker -- 40 years    Types: Cigars  . Smokeless  tobacco: Never Used     Comment: smokes 2 cigars per day  . Alcohol Use: No  . Drug Use: No  . Sexual Activity: Not on file   Other Topics Concern  . Not on file   Social History Narrative    No Known Allergies  Current Outpatient Prescriptions  Medication Sig Dispense Refill  . albuterol (PROVENTIL HFA;VENTOLIN HFA) 108 (90 BASE) MCG/ACT inhaler Inhale 2 puffs into the lungs daily.    Marland Kitchen aspirin EC 81 MG tablet Take 81 mg by mouth at bedtime.    . beclomethasone (QVAR) 80 MCG/ACT inhaler Inhale 1 puff into the lungs 2 (two) times daily.    . cholecalciferol (VITAMIN D) 1000 UNITS tablet Take 1,000 Units by mouth daily.    . Clobetasol Prop Emollient Base (CLOBETASOL PROPIONATE E) 0.05 % emollient cream Apply topically 2 (two) times daily.    Marland Kitchen doxepin (SINEQUAN) 100 MG capsule Take 100 mg by mouth at bedtime.    Marland Kitchen FLUoxetine (PROZAC) 20 MG capsule Take 20 mg by mouth every morning.    Marland Kitchen guaiFENesin (MUCINEX) 600 MG 12 hr tablet Take by mouth 2 (two) times daily.    . hydrochlorothiazide (HYDRODIURIL) 25 MG tablet Take 25 mg by mouth every morning.    Marland Kitchen lisinopril (PRINIVIL,ZESTRIL) 10 MG tablet Take 10 mg by mouth every morning.    . meloxicam (MOBIC) 7.5  MG tablet Take 7.5 mg by mouth daily.    . metFORMIN (GLUCOPHAGE-XR) 500 MG 24 hr tablet Take 500 mg by mouth 3 (three) times daily.     . simvastatin (ZOCOR) 10 MG tablet Take 10 mg by mouth daily.    . timolol (BETIMOL) 0.25 % ophthalmic solution Place 1-2 drops into the left eye 2 (two) times daily.    Marland Kitchen tiotropium (SPIRIVA) 18 MCG inhalation capsule Place 18 mcg into inhaler and inhale daily.    . Travoprost, BAK Free, (TRAVATAN) 0.004 % SOLN ophthalmic solution Place 1 drop into the left eye at bedtime.    Marland Kitchen omeprazole (PRILOSEC) 40 MG capsule Take 40 mg by mouth every morning.     No current facility-administered medications for this visit.    REVIEW OF SYSTEMS:  [X]  denotes positive finding, [ ]  denotes negative  finding Cardiac  Comments:  Chest pain or chest pressure:    Shortness of breath upon exertion:    Short of breath when lying flat:    Irregular heart rhythm:        Vascular    Pain in calf, thigh, or hip brought on by ambulation: X Right calf and thigh  Pain in feet at night that wakes you up from your sleep:     Blood clot in your veins:    Leg swelling:         Pulmonary    Oxygen at home:    Productive cough:     Wheezing:         Neurologic    Sudden weakness in arms or legs:     Sudden numbness in arms or legs:     Sudden onset of difficulty speaking or slurred speech:    Temporary loss of vision in one eye:     Problems with dizziness:         Gastrointestinal    Blood in stool:     Vomited blood:         Genitourinary    Burning when urinating:     Blood in urine:        Psychiatric    Major depression:         Hematologic    Bleeding problems:    Problems with blood clotting too easily:        Skin    Rashes or ulcers:        Constitutional    Fever or chills:      PHYSICAL EXAM: Filed Vitals:   03/16/15 1019  BP: 117/72  Pulse: 69  Temp: 98 F (36.7 C)  TempSrc: Oral  Resp: 16  Height: 5\' 11"  (1.803 m)  Weight: 198 lb (89.812 kg)  SpO2: 97%    GENERAL: The patient is a well-nourished male, in no acute distress. The vital signs are documented above. CARDIAC: There is a regular rate and rhythm.  VASCULAR: do not detect carotid bruits. He has palpable femoral pulses bilaterally. I cannot palpate popliteal or pedal pulses. He has mild bilateral lower extremity swelling. He has some large varicosities along the medial aspect of his right leg and also the medial aspect of his left thigh and leg. PULMONARY: There is good air exchange bilaterally without wheezing or rales. ABDOMEN: Soft and non-tender with normal pitched bowel sounds.  MUSCULOSKELETAL: There are no major deformities or cyanosis. NEUROLOGIC: No focal weakness or paresthesias are  detected. SKIN: There are no ulcers or rashes noted. PSYCHIATRIC: The patient has a normal  affect.  DATA:  I have reviewed the Doppler study that was done by [facility] imaging on 01/27/2015. This was a Doppler study with exercise. At rest, ABI in the right was 100% and ABI on the left was 81%. The ABI on the right dropped to 38% with exercise. The ABI on the left remains stable. This suggests occlusive disease in the right iliac artery.   I have independently interpreted his aortoiliac duplex scan that was done today in our office. He does have elevated velocities in the mid right common iliac artery with a peak systolic velocity of [id number] cm/s. This would suggest a less than 50% right iliac artery stenosis. There also a simple in a velocities in the proximal left external iliac artery. Again the velocities would suggest a less than 50% stenosis.  MEDICAL ISSUES:  BILATERAL ILIAC ARTERY OCCLUSIVE DISEASE: He has a less than 50% right common iliac artery stenosis, and a less than 50% left external iliac artery stenosis. His symptoms are tolerable. I do think that his thigh pain on the right could be partly related to his iliac artery occlusive disease. However some of the pain may be related to his back given that the pain is along the lateral aspect of his thigh. We have discussed the importance of tobacco cessation. I have encouraged him to stay as active as possible. If his symptoms worsen, I would be happy to see him back in any time. Nadara Mustard this point I would not recommend an aggressive approach given his age and well-tolerated symptoms.   Deitra Mayo [facility] [place]: [phone]

## 2015-05-25 DIAGNOSIS — I739 Peripheral vascular disease, unspecified: Secondary | ICD-10-CM | POA: Diagnosis not present

## 2015-05-25 DIAGNOSIS — E1151 Type 2 diabetes mellitus with diabetic peripheral angiopathy without gangrene: Secondary | ICD-10-CM | POA: Diagnosis not present

## 2015-05-25 DIAGNOSIS — I10 Essential (primary) hypertension: Secondary | ICD-10-CM | POA: Diagnosis not present

## 2015-05-25 DIAGNOSIS — Z7984 Long term (current) use of oral hypoglycemic drugs: Secondary | ICD-10-CM | POA: Diagnosis not present

## 2015-05-25 DIAGNOSIS — J441 Chronic obstructive pulmonary disease with (acute) exacerbation: Secondary | ICD-10-CM | POA: Diagnosis not present

## 2015-06-13 DIAGNOSIS — H7011 Chronic mastoiditis, right ear: Secondary | ICD-10-CM | POA: Diagnosis not present

## 2015-06-15 DIAGNOSIS — H401131 Primary open-angle glaucoma, bilateral, mild stage: Secondary | ICD-10-CM | POA: Diagnosis not present

## 2015-06-23 DIAGNOSIS — L84 Corns and callosities: Secondary | ICD-10-CM | POA: Diagnosis not present

## 2015-06-23 DIAGNOSIS — E1151 Type 2 diabetes mellitus with diabetic peripheral angiopathy without gangrene: Secondary | ICD-10-CM | POA: Diagnosis not present

## 2015-06-23 DIAGNOSIS — B351 Tinea unguium: Secondary | ICD-10-CM | POA: Diagnosis not present

## 2015-06-29 DIAGNOSIS — H7583 Other specified disorders of middle ear and mastoid in diseases classified elsewhere, bilateral: Secondary | ICD-10-CM | POA: Diagnosis not present

## 2015-06-29 DIAGNOSIS — H7101 Cholesteatoma of attic, right ear: Secondary | ICD-10-CM | POA: Diagnosis not present

## 2015-06-30 ENCOUNTER — Other Ambulatory Visit: Payer: Self-pay | Admitting: Otolaryngology

## 2015-06-30 DIAGNOSIS — H7101 Cholesteatoma of attic, right ear: Secondary | ICD-10-CM

## 2015-07-07 ENCOUNTER — Ambulatory Visit
Admission: RE | Admit: 2015-07-07 | Discharge: 2015-07-07 | Disposition: A | Discharge status: Home / Self Care | Payer: Medicare Other | Source: Ambulatory Visit | Attending: Otolaryngology | Admitting: Otolaryngology

## 2015-07-07 DIAGNOSIS — H7191 Unspecified cholesteatoma, right ear: Secondary | ICD-10-CM | POA: Diagnosis not present

## 2015-07-07 DIAGNOSIS — H7101 Cholesteatoma of attic, right ear: Secondary | ICD-10-CM

## 2015-07-14 DIAGNOSIS — H7583 Other specified disorders of middle ear and mastoid in diseases classified elsewhere, bilateral: Secondary | ICD-10-CM | POA: Diagnosis not present

## 2015-07-14 DIAGNOSIS — H7193 Unspecified cholesteatoma, bilateral: Secondary | ICD-10-CM | POA: Diagnosis not present

## 2015-07-19 DIAGNOSIS — E119 Type 2 diabetes mellitus without complications: Secondary | ICD-10-CM | POA: Diagnosis not present

## 2015-07-20 ENCOUNTER — Telehealth: Payer: Self-pay | Admitting: Internal Medicine

## 2015-07-20 NOTE — Telephone Encounter (Signed)
Called and spoke with Thomas Mcconnell at Dr. Janeice Robinson office, scheduled patient to see Dr. Melvyn Novas tomorrow at 9:45am.   Thomas Mcconnell stated that she would contact patient to advise him of appointment.  Advised her to have patient arrive 15 min early to complete paperwork.  Nothing further needed.

## 2015-07-21 ENCOUNTER — Ambulatory Visit: Payer: Medicare Other | Admitting: Internal Medicine

## 2015-07-21 ENCOUNTER — Ambulatory Visit (INDEPENDENT_AMBULATORY_CARE_PROVIDER_SITE_OTHER)
Admission: RE | Admit: 2015-07-21 | Discharge: 2015-07-21 | Disposition: A | Discharge status: Home / Self Care | Payer: Medicare Other | Source: Ambulatory Visit | Attending: Internal Medicine | Admitting: Internal Medicine

## 2015-07-21 ENCOUNTER — Telehealth: Payer: Self-pay | Admitting: Internal Medicine

## 2015-07-21 ENCOUNTER — Ambulatory Visit (INDEPENDENT_AMBULATORY_CARE_PROVIDER_SITE_OTHER): Payer: Medicare Other | Admitting: Internal Medicine

## 2015-07-21 ENCOUNTER — Encounter: Payer: Self-pay | Admitting: Internal Medicine

## 2015-07-21 ENCOUNTER — Other Ambulatory Visit: Payer: Medicare Other

## 2015-07-21 VITALS — BP 108/62 | HR 71 | Ht 71.0 in | Wt 200.0 lb

## 2015-07-21 DIAGNOSIS — R059 Cough, unspecified: Secondary | ICD-10-CM | POA: Insufficient documentation

## 2015-07-21 DIAGNOSIS — J449 Chronic obstructive pulmonary disease, unspecified: Secondary | ICD-10-CM

## 2015-07-21 DIAGNOSIS — I10 Essential (primary) hypertension: Secondary | ICD-10-CM | POA: Diagnosis not present

## 2015-07-21 DIAGNOSIS — R05 Cough: Secondary | ICD-10-CM | POA: Diagnosis not present

## 2015-07-21 MED ORDER — MOMETASONE FURO-FORMOTEROL FUM 100-5 MCG/ACT IN AERO: INHALATION_SPRAY | RESPIRATORY_TRACT | Status: DC

## 2015-07-21 MED ORDER — VALSARTAN 80 MG PO TABS: 80.0000 mg | ORAL_TABLET | Freq: Every day | ORAL | Status: AC

## 2015-07-21 NOTE — Telephone Encounter (Signed)
Notes Recorded by Tanda Rockers, MD on 07/21/2015 at 12:43 PM Call pt: Reviewed cxr and no acute change so no change in recommendations made at [facility] and spoke to pt. Informed him of the results and recs per MW. Pt verbalized understanding and denied any further questions or concerns at this time.

## 2015-07-21 NOTE — Progress Notes (Signed)
Quick Note:  Called and spoke to pt. Informed him of the results and recs per MW. Pt verbalized understanding and denied any further questions or concerns at this time.   ______ 

## 2015-07-21 NOTE — Progress Notes (Signed)
Subjective:    Patient ID: Thomas Mcconnell, male    DOB: 1934-10-14,   MRN: [id number]  HPI  22 yowm cigar smoker referred to pulmonary clinic 07/21/2015 by Dr Constance Holster for preop eval for ear surgery   07/21/2015 1st [place] office visit/ Deniro Laymon  maint rx ppi/ spiriva/qvar acei Chief Complaint  Patient presents with  . Pulmonary Consult    Referred by Dr. Constance Holster for pulmonary clearance for ear surgery. He states "I have bronchitis, but I am breathing okay". He c/o prod cough with min white sputum.  c/o cough/ congestion x 15 y worse in am / clears p one hour > No purulent sputum or mucus plugs   No better with saba Sense of choking on large pills "always" while on ACEi no better with ppi  Doe = MMRC1 = can walk nl pace, flat grade, can't hurry or go uphills or steps s sob    No obvious other patterns in day to day or daytime variabilty or assoc chronic cough or cp or chest tightness, subjective wheeze overt sinus or hb symptoms. No unusual exp hx or h/o childhood pna/ asthma or knowledge of premature birth.  Sleeping ok without nocturnal  or early am exacerbation  of respiratory  c/o's or need for noct saba. Also denies any obvious fluctuation of symptoms with weather or environmental changes or other aggravating or alleviating factors except as outlined above   Current Medications, Allergies, Complete Past Medical History, Past Surgical History, Family History, and Social History were reviewed in [employer] record.            Review of Systems  Constitutional: Negative for fever, chills, activity change, appetite change and unexpected weight change.  HENT: Positive for sore throat and trouble swallowing. Negative for congestion, dental problem, postnasal drip, rhinorrhea, sneezing and voice change.   Eyes: Negative for visual disturbance.  Respiratory: Positive for cough and shortness of breath. Negative for choking.   Cardiovascular: Negative for chest  pain and leg swelling.  Gastrointestinal: Negative for nausea, vomiting and abdominal pain.  Genitourinary: Negative for difficulty urinating.  Musculoskeletal: Positive for arthralgias.  Skin: Negative for rash.  Psychiatric/Behavioral: Negative for behavioral problems and confusion.       Objective:   Physical Exam   Amb mod hoarse wm nad with prominent upper airway wheeze   Wt Readings from Last 3 Encounters:  07/21/15 200 lb (90.719 kg)  03/16/15 198 lb (89.812 kg)  12/28/14 190 lb (86.183 kg)    Vital signs reviewed  HEENT: nl  turbinates, and oropharynx. Nl external ear canals without cough reflex- no upper teeth, two lower incisor remnants   NECK :  without JVD/Nodes/TM/ nl carotid upstrokes bilaterally   LUNGS: no acc muscle use,  Nl contour chest with prominent bilateral insp and exp rhonchi/ pseudowheezing   CV:  RRR  no s3 or murmur or increase in P2, no edema   ABD:  soft and nontender with nl inspiratory excursion in the supine position. No bruits or organomegaly, bowel sounds nl  MS:  Nl gait/ ext warm without deformities, calf tenderness, cyanosis or clubbing No obvious joint restrictions   SKIN: warm and dry without lesions    NEURO:  alert, approp, nl sensorium with  no motor deficits   CXR PA and Lateral:   07/21/2015 :    I personally reviewed images and agree with radiology impression as follows:    There is no active cardiopulmonary disease.  Assessment & Plan:

## 2015-07-21 NOTE — Progress Notes (Signed)
Quick Note:  lmtcb ______ 

## 2015-07-21 NOTE — Patient Instructions (Addendum)
Stop lisinopril and qvar and spiriva  Start diovan(valsartan) 80 mg daily   Plan A = Automatic = dulera 100 Take 2 puffs first thing in am and then another 2 puffs about 12 hours later.   Plan B = Backup Only use your albuterol as a rescue medication to be used if you can't catch your breath by resting or doing a relaxed purse lip breathing pattern.  - The less you use it, the better it will work when you need it. - Ok to use up to 2 puffs  every 4 hours if you must but call for appointment if use goes up over your usual need - Don't leave home without it !!  (think of it like the spare tire for your car)     The key is to stop smoking completely before smoking completely stops you - it's clearly not too late  Please remember to go to the   x-ray department downstairs for your tests - we will call you with the results when they are available.      Please schedule a follow up office visit in 2 weeks, sooner if needed

## 2015-07-24 DIAGNOSIS — I10 Essential (primary) hypertension: Secondary | ICD-10-CM | POA: Insufficient documentation

## 2015-07-24 NOTE — Assessment & Plan Note (Addendum)
-   Spirometry 07/21/2015  FEV1 2.35 (70%)  Ratio 63   DDX of  difficult airways management almost all start with A and  include Adherence, Ace Inhibitors, Acid Reflux, Active Sinus Disease, Alpha 1 Antitripsin deficiency, Anxiety masquerading as Airways dz,  ABPA,  Allergy(esp in young), Aspiration (esp in elderly), Adverse effects of meds,  Active smokers, A bunch of PE's (a small clot burden can't cause this syndrome unless there is already severe underlying pulm or vascular dz with poor reserve) plus two Bs  = Bronchiectasis and Beta blocker use..and one C= CHF  Adherence is always the initial "prime suspect" and is a multilayered concern that requires a "trust but verify" approach in every patient - starting with knowing how to use medications, especially inhalers, correctly, keeping up with refills and understanding the fundamental difference between maintenance and prns vs those medications only taken for a very short course and then stopped and not refilled.  - - The proper method of use, as well as anticipated side effects, of a metered-dose inhaler are discussed and demonstrated to the patient. Improved effectiveness after extensive coaching during this visit to a level of approximately 75 % from a baseline of 50 %   ? ACEi > top of the usual list of suspects given assoc dysphagia  ? Adverse effects of dpi > try off all dpi's> d/c spiriva   ? Acid (or non-acid) GERD > always difficult to exclude as up to 75% of pts in some series report no assoc GI/ Heartburn symptoms> rec continue max (24h)  acid suppression and diet restrictions/ reviewed     ? BB effect > note on timolol eyedrops but not really much asthmatic component so ok to leave on for now   I had an extended discussion with the patient reviewing all relevant studies completed to date and  lasting  35 minutes of a 60 minute visit    1) should wait until we have his problem sorted out before elective surgery > should be able to clear  in 2 weeks > return ov first   2) Each maintenance medication was reviewed in detail including most importantly the difference between maintenance and prns and under what circumstances the prns are to be triggered using an action plan format that is not reflected in the computer generated alphabetically organized AVS.    Please see instructions for details which were reviewed in writing and the patient given a copy highlighting the part that I personally wrote and discussed at today's ov.

## 2015-07-24 NOTE — Assessment & Plan Note (Signed)
In the best review of chronic cough to date ( NEJM 2016 375 [phone]) ,  ACEi are now felt to cause cough in up to  20% of pts which is a 4 fold increase from previous reports and does not include the variety of non-specific complaints we see in pulmonary clinic in pts on ACEi but previously attributed to another dx like  Copd/asthma and  include PNDS, throat and chest congestion, "bronchitis", unexplained dyspnea and noct "strangling" sensations, and hoarseness, but also  atypical /refractory GERD symptoms like dysphagia and "bad heartburn"   The only way I know  to prove this is not an "ACEi Case" is a trial off ACEi x a minimum of 6 weeks then regroup - in his case see back in 2 weeks on diovan 80 mg daily to see if we can clear him for surgery.

## 2015-07-24 NOTE — Assessment & Plan Note (Signed)
Classic Upper airway cough syndrome, so named because it's frequently impossible to sort out how much is  CR/sinusitis with freq throat clearing (which can be related to primary GERD)   vs  causing  secondary (" extra esophageal")  GERD from wide swings in gastric pressure that occur with throat clearing, often  promoting self use of mint and menthol lozenges that reduce the lower esophageal sphincter tone and exacerbate the problem further in a cyclical fashion.   These are the same pts (now being labeled as having "irritable larynx syndrome" by some cough centers) who not infrequently have a history of having failed to tolerate ace inhibitors,  dry powder inhalers(both of which apply here) or biphosphonates or report having atypical reflux symptoms that don't respond to standard doses of PPI , and are easily confused as having aecopd or asthma flares by even experienced allergists/ pulmonologists.   First try off acei then regroup in 2 weeks

## 2015-08-04 ENCOUNTER — Encounter: Payer: Self-pay | Admitting: Acute Care

## 2015-08-04 ENCOUNTER — Ambulatory Visit (INDEPENDENT_AMBULATORY_CARE_PROVIDER_SITE_OTHER): Payer: Medicare Other | Admitting: Acute Care

## 2015-08-04 ENCOUNTER — Telehealth: Payer: Self-pay | Admitting: Internal Medicine

## 2015-08-04 VITALS — BP 124/70 | HR 69 | Ht 71.0 in | Wt 199.0 lb

## 2015-08-04 DIAGNOSIS — R062 Wheezing: Secondary | ICD-10-CM | POA: Diagnosis not present

## 2015-08-04 DIAGNOSIS — R059 Cough, unspecified: Secondary | ICD-10-CM

## 2015-08-04 DIAGNOSIS — R05 Cough: Secondary | ICD-10-CM

## 2015-08-04 DIAGNOSIS — I10 Essential (primary) hypertension: Secondary | ICD-10-CM

## 2015-08-04 DIAGNOSIS — J449 Chronic obstructive pulmonary disease, unspecified: Secondary | ICD-10-CM

## 2015-08-04 MED ORDER — METHYLPREDNISOLONE ACETATE 80 MG/ML IJ SUSP
120.0000 mg | Freq: Once | INTRAMUSCULAR | Status: AC
  Administered 2015-08-04: 120 mg via INTRAMUSCULAR

## 2015-08-04 MED ORDER — MOMETASONE FURO-FORMOTEROL FUM 100-5 MCG/ACT IN AERO: INHALATION_SPRAY | RESPIRATORY_TRACT | Status: AC

## 2015-08-04 NOTE — Patient Instructions (Addendum)
It was nice to meet you today. We will send in a prescription for the [facility]. Use 2 puffs twice daily as your maintenance. Continue using that 2 puffs twice daily Continue using your Albuterol inhaler as you rescue for shortness of breath or wheezing every 6 hours as needed Continue your Valsartan for blood pressure as your blood pressure is well controlled today. We will clear you for surgery. Please try to quit smoking. Follow up with Dr. Melvyn Novas in 4 weeks Depo Medrol injection today for wheezing. Please contact office for sooner follow up if symptoms do not improve or worsen or seek emergency care

## 2015-08-04 NOTE — Telephone Encounter (Signed)
LMTC x 1  

## 2015-08-04 NOTE — Telephone Encounter (Signed)
Spoke with pt and clarified that he was wanting a refill on the Diovan. Per pt chart the rx was sent to [facility] on 07/21/15 with 11 refills. Advised pt to contact [facility] for refill. Pt states that he will call them. Nothing further needed.

## 2015-08-04 NOTE — Assessment & Plan Note (Signed)
Cough with improvement after D/C'ing ACEI. BP well controlled on Valsartan 80 mg daily Plan: Continue Valsartan 80 mg daily Follow up with Dr. Melvyn Novas in 1 month Please contact office for sooner follow up if symptoms do not improve or worsen or seek emergency care

## 2015-08-04 NOTE — Assessment & Plan Note (Signed)
ACEI D/C's 07/21/2015 Valsartan 80 mg daily substituted Blood Pressure well controlled today. Pt. States cough is better Plan: Continue Valsartan 80 mg daily Follow up with Dr. Melvyn Novas in 4 weeks

## 2015-08-04 NOTE — Progress Notes (Signed)
Subjective:    Patient ID: Thomas Mcconnell, male    DOB: December 20, 1934, 81 y.o.   MRN: [id number]  HPI 45 yowm cigar smoker referred to pulmonary clinic 07/21/2015 by Dr Constance Holster for preop eval for ear surgery   Tests: Spirometry 07/21/2015 FEV1 2.35 (70%) Ratio 63 COPD Gold II   07/21/2015: CXR No active cardiopulmonary disease   Recommendations per Dr. Melvyn Novas 07/21/2015: Trial off ACEI: Lisinopril d/c'd: Started on Diovan ( Valsartan) 80 mg daily. D/C Dry powder inhalers: Qvar and Spiriva D/C'd Start: Dulera 100 2 puffs q am, 2 puffs q pm. Albuterol as rescue Stop smoking Return in 2 weeks for follow up to assess better control of symptoms/ surgical clearance   08/04/2015 Follow Up Office Visit: Pt. Returns to the office today for follow up.His  cough is better. He states that he is  breathing better, but not great, states he has a rattle at times..He states he does not have shortness of breath. BP is well controlled on Valsartan 124/70 today,and he states that his mouth is no longer dry. Minimal cough with white secretions. Some wheezing noted.He denies chest pain, fever, hemoptysis, orthopnea, leg or calf pain.    Current outpatient prescriptions:  .  albuterol (PROVENTIL HFA;VENTOLIN HFA) 108 (90 BASE) MCG/ACT inhaler, Inhale 2 puffs into the lungs daily., Disp: , Rfl:  .  aspirin EC 81 MG tablet, Take 81 mg by mouth at bedtime., Disp: , Rfl:  .  cholecalciferol (VITAMIN D) 1000 UNITS tablet, Take 1,000 Units by mouth daily., Disp: , Rfl:  .  Clobetasol Prop Emollient Base (CLOBETASOL PROPIONATE E) 0.05 % emollient cream, Apply topically 2 (two) times daily., Disp: , Rfl:  .  doxepin (SINEQUAN) 100 MG capsule, Take 100 mg by mouth at bedtime., Disp: , Rfl:  .  FLUoxetine (PROZAC) 20 MG capsule, Take 20 mg by mouth every morning., Disp: , Rfl:  .  guaiFENesin (MUCINEX) 600 MG 12 hr tablet, Take by mouth 2 (two) times daily., Disp: , Rfl:  .  hydrochlorothiazide (HYDRODIURIL) 25 MG  tablet, Take 25 mg by mouth every morning., Disp: , Rfl:  .  meloxicam (MOBIC) 7.5 MG tablet, Take 7.5 mg by mouth daily., Disp: , Rfl:  .  metFORMIN (GLUCOPHAGE-XR) 500 MG 24 hr tablet, Take 500 mg by mouth 3 (three) times daily. , Disp: , Rfl:  .  mometasone-formoterol (DULERA) 100-5 MCG/ACT AERO, Take 2 puffs first thing in am and then another 2 puffs about 12 hours later., Disp: , Rfl: 0 .  omeprazole (PRILOSEC) 40 MG capsule, Take 40 mg by mouth every morning., Disp: , Rfl:  .  simvastatin (ZOCOR) 10 MG tablet, Take 10 mg by mouth daily., Disp: , Rfl:  .  timolol (BETIMOL) 0.25 % ophthalmic solution, Place 1-2 drops into the left eye 2 (two) times daily., Disp: , Rfl:  .  Travoprost, BAK Free, (TRAVATAN) 0.004 % SOLN ophthalmic solution, Place 1 drop into the left eye at bedtime., Disp: , Rfl:  .  valsartan (DIOVAN) 80 MG tablet, Take 1 tablet (80 mg total) by mouth daily., Disp: 30 tablet, Rfl: 11   Past Medical History  Diagnosis Date  . Hypertension   . DVT (deep venous thrombosis) ([place])     s/p ankle surgery  . Bronchitis   . Diabetes mellitus without complication ([place])     oral meds only  . Depression   . GERD (gastroesophageal reflux disease)   . Cancer [facility])     Prostate  cancer"seed implant" 10 years ago  . Glaucoma   . COPD (chronic obstructive pulmonary disease) ([place])     noted CXR 2011 Epic  . Hearing impaired     hearing aids bilaterally    No Known Allergies   Review of Systems  Constitutional:   No  weight loss, night sweats,  Fevers, chills, fatigue, or  lassitude.  HEENT:   No headaches,  Difficulty swallowing,  Tooth/dental problems, or  Sore throat,                No sneezing, itching, ear ache, nasal congestion, post nasal drip,   CV:  No chest pain,  Orthopnea, PND, swelling in lower extremities, anasarca, dizziness, palpitations, syncope.   GI  No heartburn, indigestion, abdominal pain, nausea, vomiting, diarrhea, change in bowel habits, loss of  appetite, bloody stools.   Resp: No shortness of breath with exertion or at rest.  + excess white mucus , + productive cough,  No non-productive cough,  No coughing up of blood.  No change in color of mucus.  + wheezing.  No chest wall deformity  Skin: no rash or lesions.  GU: no dysuria, change in color of urine, no urgency or frequency.  No flank pain, no hematuria   MS:  No joint pain or swelling.  No decreased range of motion.  No back pain.  Psych:  No change in mood or affect. No depression or anxiety.  No memory loss.      Objective:   Physical Exam BP 124/70 mmHg  Pulse 69  Ht 5\' 11"  (1.803 m)  Wt 199 lb (90.266 kg)  BMI 27.77 kg/m2  SpO2 95%  Physical Exam:  General- No distress,  A&Ox3, obese ENT: No sinus tenderness, TM clear, pale nasal mucosa, no oral exudate,no post nasal drip, no LAN Cardiac: S1, S2, regular rate and rhythm, no murmur Chest: + exp. wheeze/ rales/ dullness; no accessory muscle use, no nasal flaring, no sternal retractions Abd.: Soft Non-tender Ext: No clubbing cyanosis, edema Neuro:  normal strength Skin: No rashes, warm and dry Psych: normal mood and behavior  Magdalen Spatz, AGACNP-BC [facility]     Assessment & Plan:

## 2015-08-04 NOTE — Assessment & Plan Note (Addendum)
ACEI d/c'd 07/21/2015 for dysphagia/cough/ wheeze  Qvar and Spiriva D/C's 07/21/2015: ( eliminating DPI's) Started Dulera 100 2 puffs BID Pt. States breathing is better, but some wheezing noted Plan: DepoMedrol 120 mg today Continue Dulera Continue Valsartan Continue max acid suppression/ diet restrictions  MW addendum:  Chart and office note reviewed in detail along with available xrays/ labs > agree with a/p as outline  I have independently seen and examined this pt today  and although he's improved he still has some subjective wheeze that is mostly upper airway with relatively good pfts noted.  Since this is relatively minor surgery planned believe it's reasonable (unless worse between now and surgery) to proceed with surgery.  d    Follow up with Dr. Melvyn Novas in 1 month

## 2015-08-05 NOTE — Progress Notes (Signed)
Chart and office note reviewed in detail  And pt seen and examined - he still has a little wheezing off acei so rx with depomedrol 120  And cleared

## 2015-08-12 ENCOUNTER — Ambulatory Visit: Payer: Self-pay | Admitting: Otolaryngology

## 2015-08-12 NOTE — H&P (Signed)
  No new complaints. When he presses on his mastoid he gets vertiginous. On exam, there is a large cholesteatoma mass filling the ear canal and mastoid cavity. I was only able to remove small parts. I was unable to clean out the remainder. I reviewed the CT scan. There is erosion of the horizontal semicircular canal. The audiogram today reveals a severe to profound loss on the left and a complete profound loss on the right. There is clinical and radiographic evidence of otic capsule erosion. This is a dangerous situation and he is going to require a revision mastoidectomy. Since the hearing is completely tolerated were not worried about that. He may have acute vertigo postoperatively. We discussed that if we do not operate, he runs the risk of further injury or damage with further vertigo and potentially intracranial involvement. The surgery is intended to create a safe ear. We will go ahead and schedule him for this. He will need a pulmonary clearance.  Electronically signed by: Beckie Salts, MD 07/14/15 1044  Back to top of Progress Notes Newt Lukes Carrier, AuD CCC-A - 07/14/2015 9:00 AM EDT Audiometric Results: Results were obtained using headphones; DNT with inserts due to right ear drainage. Reliability was good. Results indicate moderate sloping to severe sensorineural hearing loss for the left ear and a profound mixed hearing loss for the right ear. Bone conduction is symmetric. Word recognition was fair for left ear and CNT right ear due to SRT at 110dB.  Recommendations: Recommend repeat testing in conjunction with otologic care.  Continue use of current amplification, which may need to be louder after everything has resolved

## 2015-08-13 NOTE — Addendum Note (Signed)
Addended by: Christinia Gully B on: 08/13/2015 06:50 AM   Modules accepted: Level of Service, SmartSet

## 2015-08-17 ENCOUNTER — Encounter (HOSPITAL_COMMUNITY): Payer: Self-pay

## 2015-08-17 ENCOUNTER — Encounter (HOSPITAL_COMMUNITY)
Admission: RE | Admit: 2015-08-17 | Discharge: 2015-08-17 | Disposition: A | Discharge status: Home / Self Care | Payer: Medicare Other | Source: Ambulatory Visit | Attending: Otolaryngology | Admitting: Otolaryngology

## 2015-08-17 DIAGNOSIS — J449 Chronic obstructive pulmonary disease, unspecified: Secondary | ICD-10-CM | POA: Diagnosis not present

## 2015-08-17 DIAGNOSIS — Z79899 Other long term (current) drug therapy: Secondary | ICD-10-CM | POA: Diagnosis not present

## 2015-08-17 DIAGNOSIS — H7191 Unspecified cholesteatoma, right ear: Secondary | ICD-10-CM | POA: Diagnosis not present

## 2015-08-17 DIAGNOSIS — I10 Essential (primary) hypertension: Secondary | ICD-10-CM | POA: Diagnosis not present

## 2015-08-17 DIAGNOSIS — E119 Type 2 diabetes mellitus without complications: Secondary | ICD-10-CM | POA: Diagnosis not present

## 2015-08-17 DIAGNOSIS — Z7984 Long term (current) use of oral hypoglycemic drugs: Secondary | ICD-10-CM | POA: Diagnosis not present

## 2015-08-17 HISTORY — DX: Unspecified chronic bronchitis: J42

## 2015-08-17 HISTORY — DX: Pain in unspecified joint: M25.50

## 2015-08-17 HISTORY — DX: Hyperlipidemia, unspecified: E78.5

## 2015-08-17 HISTORY — DX: Dizziness and giddiness: R42

## 2015-08-17 HISTORY — DX: Dorsalgia, unspecified: M54.9

## 2015-08-17 HISTORY — DX: Unspecified osteoarthritis, unspecified site: M19.90

## 2015-08-17 HISTORY — DX: Pneumonia, unspecified organism: J18.9

## 2015-08-17 HISTORY — DX: Reserved for inherently not codable concepts without codable children: IMO0001

## 2015-08-17 LAB — CBC
HCT: 40 % (ref 39.0–52.0)
Hemoglobin: 13.5 g/dL (ref 13.0–17.0)
MCH: 29.5 pg (ref 26.0–34.0)
MCHC: 33.8 g/dL (ref 30.0–36.0)
MCV: 87.5 fL (ref 78.0–100.0)
Platelets: 173 10*3/uL (ref 150–400)
RBC: 4.57 MIL/uL (ref 4.22–5.81)
RDW: 13 % (ref 11.5–15.5)
WBC: 7.1 10*3/uL (ref 4.0–10.5)

## 2015-08-17 LAB — BASIC METABOLIC PANEL
Anion gap: 9 (ref 5–15)
BUN: 16 mg/dL (ref 6–20)
CO2: 28 mmol/L (ref 22–32)
Calcium: 9.3 mg/dL (ref 8.9–10.3)
Chloride: 104 mmol/L (ref 101–111)
Creatinine, Ser: 1.37 mg/dL — ABNORMAL HIGH (ref 0.61–1.24)
GFR calc Af Amer: 55 mL/min — ABNORMAL LOW (ref >60)
GFR calc non Af Amer: 47 mL/min — ABNORMAL LOW (ref >60)
Glucose, Bld: 164 mg/dL — ABNORMAL HIGH (ref 65–99)
Potassium: 3.9 mmol/L (ref 3.5–5.1)
Sodium: 141 mmol/L (ref 135–145)

## 2015-08-17 LAB — GLUCOSE, CAPILLARY: GLUCOSE-CAPILLARY: 167 mg/dL — AB (ref 65–99)

## 2015-08-17 NOTE — Progress Notes (Addendum)
Anesthesia Chart Review:  Pt is a 81 year old male scheduled for revision of R mastoidectomy on 08/18/2015 with Dr. Constance Holster.   PCP is Dr. Herschell Dimes Shamleffer. Pulmonologist is Dr. Christinia Gully who has cleared pt for surgery.   PMH includes:  HTN, DM, hyperlipidemia, COPD, DVT, glaucoma, prostate cancer, GERD. Current smoker. BMI 27  Medications include: albuterol, ASA, hctz, metformin, dulera, prilosec, simvastatin, timolol, valsartan  Preoperative labs reviewed.  Glucose 164, hgbA1c pending.   Chest x-ray 07/21/15 reviewed. No active cardiopulmonary disease.   EKG 08/17/15: sinus rhythm with PACs.   Pt will need further assessment by assigned anesthesiologist DOS.   Willeen Cass, FNP-BC [facility]/Anesthesiology Phone: [phone] 08/17/2015 2:33 PM

## 2015-08-17 NOTE — Progress Notes (Signed)
Cardiologist denies  Medical Md is Dr.Shamlet with [facility]  Echo denies  Stress test done 16+ yrs ago  Heart cath denies  CXR in epic from 07-21-15  EKG denies in past 2 yrs

## 2015-08-17 NOTE — Pre-Procedure Instructions (Signed)
Thomas Mcconnell  08/17/2015      [id number] - [place], [place] [address] [address] Phone: [phone] Fax: [phone], [place] [address] Phone: [phone] Fax: [phone]    Your procedure is scheduled on Thurs, May 11 @ 9:15 AM  Report to [place] at 7:15 AM  Call this number if you have problems the morning of surgery:  [phone]   Remember:  Do not eat food or drink liquids after midnight.  Take these medicines the morning of surgery with A SIP OF WATER Albuterol<Bring Your Inhaler With You>,Prozac(Fluoxetine),Omeprazole(Prilosec),and Eye Drops               Stop taking your Aspirin and Mobic. No Goody's,BC's,Aleve,Advil,Motrin,Fish Oil,or any Herbal Medications.     How to Manage Your Diabetes Before and After Surgery  Why is it important to control my blood sugar before and after surgery? . Improving blood sugar levels before and after surgery helps healing and can limit problems. . A way of improving blood sugar control is eating a healthy diet by: o  Eating less sugar and carbohydrates o  Increasing activity/exercise o  Talking with your doctor about reaching your blood sugar goals . High blood sugars (greater than 180 mg/dL) can raise your risk of infections and slow your recovery, so you will need to focus on controlling your diabetes during the weeks before surgery. . Make sure that the doctor who takes care of your diabetes knows about your planned surgery including the date and location.  How do I manage my blood sugar before surgery? . Check your blood sugar at least 4 times a day, starting 2 days before surgery, to make sure that the level is not too high or low. o Check your blood sugar the morning of your surgery when you wake up and every 2 hours until you get to the Short Stay unit. . If your  blood sugar is less than 70 mg/dL, you will need to treat for low blood sugar: o Do not take insulin. o Treat a low blood sugar (less than 70 mg/dL) with  cup of clear juice (cranberry or apple), 4 glucose tablets, OR glucose gel. o Recheck blood sugar in 15 minutes after treatment (to make sure it is greater than 70 mg/dL). If your blood sugar is not greater than 70 mg/dL on recheck, call [phone] for further instructions. . Report your blood sugar to the short stay nurse when you get to Short Stay.  . If you are admitted to the hospital after surgery: o Your blood sugar will be checked by the staff and you will probably be given insulin after surgery (instead of oral diabetes medicines) to make sure you have good blood sugar levels. o The goal for blood sugar control after surgery is 80-180 mg/dL.              WHAT DO I DO ABOUT MY DIABETES MEDICATION?   Marland Kitchen Do not take oral diabetes medicines (pills) the morning of surgery.  .       .   . The day of surgery, do not take other diabetes injectables, including Byetta (exenatide), Bydureon (exenatide ER), Victoza (liraglutide), or Trulicity (dulaglutide).  . If your CBG is greater than 220 mg/dL, you may take  of your sliding scale (correction) dose of insulin.  Other Instructions:          Patient Signature:  Date:   Nurse Signature:  Date:   Reviewed and Endorsed by [facility] Patient Education Committee, August 2015   Do not wear jewelry, make-up or nail polish.  Do not wear lotions, powders, or perfumes.  You may wear deodorant.  Do not shave 48 hours prior to surgery.  Men may shave face and neck.  Do not bring valuables to the hospital.  [facility] is not responsible for any belongings or valuables.  Contacts, dentures or bridgework may not be worn into surgery.  Leave your suitcase in the car.  After surgery it may be brought to your room.  For patients admitted to the hospital, discharge time will  be determined by your treatment team.  Patients discharged the day of surgery will not be allowed to drive home.    Special instructions:  [place] - Preparing for Surgery  Before surgery, you can play an important role.  Because skin is not sterile, your skin needs to be as free of germs as possible.  You can reduce the number of germs on you skin by washing with CHG (chlorahexidine gluconate) soap before surgery.  CHG is an antiseptic cleaner which kills germs and bonds with the skin to continue killing germs even after washing.  Please DO NOT use if you have an allergy to CHG or antibacterial soaps.  If your skin becomes reddened/irritated stop using the CHG and inform your nurse when you arrive at Short Stay.  Do not shave (including legs and underarms) for at least 48 hours prior to the first CHG shower.  You may shave your face.  Please follow these instructions carefully:   1.  Shower with CHG Soap the night before surgery and the                                morning of Surgery.  2.  If you choose to wash your hair, wash your hair first as usual with your       normal shampoo.  3.  After you shampoo, rinse your hair and body thoroughly to remove the                      Shampoo.  4.  Use CHG as you would any other liquid soap.  You can apply chg directly       to the skin and wash gently with scrungie or a clean washcloth.  5.  Apply the CHG Soap to your body ONLY FROM THE NECK DOWN.        Do not use on open wounds or open sores.  Avoid contact with your eyes,       ears, mouth and genitals (private parts).  Wash genitals (private parts)       with your normal soap.  6.  Wash thoroughly, paying special attention to the area where your surgery        will be performed.  7.  Thoroughly rinse your body with warm water from the neck down.  8.  DO NOT shower/wash with your normal soap after using and rinsing off       the CHG Soap.  9.  Pat yourself dry with a clean towel.             10.  Wear clean pajamas.  11.  Place clean sheets on your bed the night of your first shower and do not        sleep with pets.  Day of Surgery  Do not apply any lotions/deoderants the morning of surgery.  Please wear clean clothes to the hospital/surgery center.    Please read over the following fact sheets that you were given. Pain Booklet, Coughing and Deep Breathing and Surgical Site Infection Prevention

## 2015-08-18 ENCOUNTER — Ambulatory Visit (HOSPITAL_COMMUNITY): Payer: Medicare Other | Admitting: Certified Registered Nurse Anesthetist

## 2015-08-18 ENCOUNTER — Ambulatory Visit (HOSPITAL_COMMUNITY): Payer: Medicare Other | Admitting: Emergency Medicine

## 2015-08-18 ENCOUNTER — Encounter (HOSPITAL_COMMUNITY): Payer: Self-pay | Admitting: Anesthesiology

## 2015-08-18 ENCOUNTER — Ambulatory Visit (HOSPITAL_COMMUNITY)
Admission: RE | Admit: 2015-08-18 | Discharge: 2015-08-18 | Disposition: A | Discharge status: Home / Self Care | Payer: Medicare Other | Source: Ambulatory Visit | Attending: Otolaryngology | Admitting: Otolaryngology

## 2015-08-18 ENCOUNTER — Encounter (HOSPITAL_COMMUNITY)
Admission: RE | Disposition: A | Discharge status: Home / Self Care | Payer: Self-pay | Source: Ambulatory Visit | Attending: Otolaryngology

## 2015-08-18 DIAGNOSIS — H7011 Chronic mastoiditis, right ear: Secondary | ICD-10-CM | POA: Diagnosis not present

## 2015-08-18 DIAGNOSIS — H7191 Unspecified cholesteatoma, right ear: Secondary | ICD-10-CM | POA: Insufficient documentation

## 2015-08-18 DIAGNOSIS — Z7984 Long term (current) use of oral hypoglycemic drugs: Secondary | ICD-10-CM | POA: Insufficient documentation

## 2015-08-18 DIAGNOSIS — J449 Chronic obstructive pulmonary disease, unspecified: Secondary | ICD-10-CM | POA: Insufficient documentation

## 2015-08-18 DIAGNOSIS — I10 Essential (primary) hypertension: Secondary | ICD-10-CM | POA: Diagnosis not present

## 2015-08-18 DIAGNOSIS — Z79899 Other long term (current) drug therapy: Secondary | ICD-10-CM | POA: Insufficient documentation

## 2015-08-18 DIAGNOSIS — E119 Type 2 diabetes mellitus without complications: Secondary | ICD-10-CM | POA: Diagnosis not present

## 2015-08-18 DIAGNOSIS — M549 Dorsalgia, unspecified: Secondary | ICD-10-CM | POA: Diagnosis not present

## 2015-08-18 DIAGNOSIS — H7101 Cholesteatoma of attic, right ear: Secondary | ICD-10-CM | POA: Diagnosis not present

## 2015-08-18 HISTORY — PX: MASTOIDECTOMY: SHX711

## 2015-08-18 LAB — HEMOGLOBIN A1C
HEMOGLOBIN A1C: 6.9 % — AB (ref 4.8–5.6)
MEAN PLASMA GLUCOSE: 151 mg/dL

## 2015-08-18 LAB — GLUCOSE, CAPILLARY
GLUCOSE-CAPILLARY: 93 mg/dL (ref 65–99)
GLUCOSE-CAPILLARY: 102 mg/dL — AB (ref 65–99)
Glucose-Capillary: 103 mg/dL — ABNORMAL HIGH (ref 65–99)

## 2015-08-18 SURGERY — MASTOIDECTOMY
Anesthesia: General | Site: Ear | Laterality: Right

## 2015-08-18 MED ORDER — LIDOCAINE-EPINEPHRINE 1 %-1:100000 IJ SOLN
INTRAMUSCULAR | Status: AC
  Filled 2015-08-18: qty 1

## 2015-08-18 MED ORDER — PROCHLORPERAZINE EDISYLATE 5 MG/ML IJ SOLN: 10.0000 mg | Freq: Once | INTRAMUSCULAR | Status: DC | PRN

## 2015-08-18 MED ORDER — CIPROFLOXACIN-DEXAMETHASONE 0.3-0.1 % OT SUSP
OTIC | Status: AC
  Filled 2015-08-18: qty 7.5

## 2015-08-18 MED ORDER — LIDOCAINE HCL (CARDIAC) 20 MG/ML IV SOLN
INTRAVENOUS | Status: DC | PRN
  Administered 2015-08-18: 120 mg via INTRAVENOUS

## 2015-08-18 MED ORDER — BACITRACIN ZINC 500 UNIT/GM EX OINT
TOPICAL_OINTMENT | CUTANEOUS | Status: DC | PRN
  Administered 2015-08-18: 1 via TOPICAL

## 2015-08-18 MED ORDER — FENTANYL CITRATE (PF) 250 MCG/5ML IJ SOLN
INTRAMUSCULAR | Status: AC
  Filled 2015-08-18: qty 5

## 2015-08-18 MED ORDER — FENTANYL CITRATE (PF) 100 MCG/2ML IJ SOLN
INTRAMUSCULAR | Status: DC | PRN
  Administered 2015-08-18: 100 ug via INTRAVENOUS
  Administered 2015-08-18: 50 ug via INTRAVENOUS

## 2015-08-18 MED ORDER — ONDANSETRON HCL 4 MG/2ML IJ SOLN
INTRAMUSCULAR | Status: DC | PRN
  Administered 2015-08-18: 4 mg via INTRAVENOUS

## 2015-08-18 MED ORDER — SUGAMMADEX SODIUM 200 MG/2ML IV SOLN
INTRAVENOUS | Status: DC | PRN
  Administered 2015-08-18: 200 mg via INTRAVENOUS

## 2015-08-18 MED ORDER — METHYLENE BLUE 0.5 % INJ SOLN
INTRAVENOUS | Status: DC | PRN
  Administered 2015-08-18: 10 mL

## 2015-08-18 MED ORDER — EPINEPHRINE HCL 1 MG/ML IJ SOLN
INTRAMUSCULAR | Status: DC | PRN
  Administered 2015-08-18: 1 mg

## 2015-08-18 MED ORDER — ROCURONIUM BROMIDE 100 MG/10ML IV SOLN
INTRAVENOUS | Status: DC | PRN
  Administered 2015-08-18: 50 mg via INTRAVENOUS

## 2015-08-18 MED ORDER — GELATIN ABSORBABLE 12-7 MM EX MISC
CUTANEOUS | Status: DC | PRN
  Administered 2015-08-18: 1 via TOPICAL

## 2015-08-18 MED ORDER — BACITRACIN ZINC 500 UNIT/GM EX OINT
TOPICAL_OINTMENT | CUTANEOUS | Status: AC
  Filled 2015-08-18: qty 28.35

## 2015-08-18 MED ORDER — HYDROMORPHONE HCL 1 MG/ML IJ SOLN: 0.2500 mg | INTRAMUSCULAR | Status: DC | PRN

## 2015-08-18 MED ORDER — CIPROFLOXACIN-DEXAMETHASONE 0.3-0.1 % OT SUSP
OTIC | Status: DC | PRN
  Administered 2015-08-18: 4 [drp] via OTIC

## 2015-08-18 MED ORDER — PROMETHAZINE HCL 25 MG RE SUPP: 25.0000 mg | Freq: Three times a day (TID) | RECTAL | Status: AC | PRN

## 2015-08-18 MED ORDER — LIDOCAINE-EPINEPHRINE 1 %-1:100000 IJ SOLN
INTRAMUSCULAR | Status: DC | PRN
  Administered 2015-08-18: 20 mL via INTRADERMAL

## 2015-08-18 MED ORDER — SODIUM CHLORIDE 0.9 % IR SOLN
Status: DC | PRN
  Administered 2015-08-18: 1000 mL

## 2015-08-18 MED ORDER — GLYCOPYRROLATE 0.2 MG/ML IJ SOLN
INTRAMUSCULAR | Status: DC | PRN
  Administered 2015-08-18: .4 mg via INTRAVENOUS

## 2015-08-18 MED ORDER — PROPOFOL 10 MG/ML IV BOLUS
INTRAVENOUS | Status: AC
  Filled 2015-08-18: qty 20

## 2015-08-18 MED ORDER — CIPROFLOXACIN-DEXAMETHASONE 0.3-0.1 % OT SUSP: 3.0000 [drp] | Freq: Three times a day (TID) | OTIC | Status: AC

## 2015-08-18 MED ORDER — EPINEPHRINE HCL 1 MG/ML IJ SOLN
INTRAMUSCULAR | Status: AC
  Filled 2015-08-18: qty 1

## 2015-08-18 MED ORDER — GELATIN ADSORBABLE OP FILM: ORAL_FILM | OPHTHALMIC | Status: DC | PRN

## 2015-08-18 MED ORDER — OXYCODONE HCL 5 MG PO TABS
ORAL_TABLET | ORAL | Status: DC
Start: 2015-08-18 — End: 2015-08-18
  Filled 2015-08-18: qty 1

## 2015-08-18 MED ORDER — EPHEDRINE SULFATE-NACL 50-0.9 MG/10ML-% IV SOSY
PREFILLED_SYRINGE | INTRAVENOUS | Status: DC | PRN
  Administered 2015-08-18 (×5): 10 mg via INTRAVENOUS

## 2015-08-18 MED ORDER — HYDROCODONE-ACETAMINOPHEN 7.5-325 MG PO TABS: 1.0000 | ORAL_TABLET | Freq: Four times a day (QID) | ORAL | Status: AC | PRN

## 2015-08-18 MED ORDER — PROPOFOL 10 MG/ML IV BOLUS
INTRAVENOUS | Status: DC | PRN
  Administered 2015-08-18: 100 mg via INTRAVENOUS

## 2015-08-18 MED ORDER — LACTATED RINGERS IV SOLN
INTRAVENOUS | Status: DC
Start: 2015-08-18 — End: 2015-08-18
  Administered 2015-08-18 (×2): via INTRAVENOUS

## 2015-08-18 MED ORDER — ONDANSETRON HCL 4 MG/2ML IJ SOLN
INTRAMUSCULAR | Status: AC
  Filled 2015-08-18: qty 2

## 2015-08-18 MED ORDER — METHYLENE BLUE 0.5 % INJ SOLN
INTRAVENOUS | Status: AC
  Filled 2015-08-18: qty 10

## 2015-08-18 MED ORDER — LIDOCAINE 2% (20 MG/ML) 5 ML SYRINGE
INTRAMUSCULAR | Status: AC
  Filled 2015-08-18: qty 5

## 2015-08-18 MED ORDER — ROCURONIUM BROMIDE 50 MG/5ML IV SOLN
INTRAVENOUS | Status: AC
  Filled 2015-08-18: qty 1

## 2015-08-18 MED ORDER — OXYCODONE HCL 5 MG PO TABS
5.0000 mg | ORAL_TABLET | Freq: Once | ORAL | Status: AC
  Administered 2015-08-18: 5 mg via ORAL

## 2015-08-18 SURGICAL SUPPLY — 69 items
BENZOIN TINCTURE PRP APPL 2/3 (GAUZE/BANDAGES/DRESSINGS) ×2 IMPLANT
BLADE EAR TYMPAN 2.5 60D BEAV (BLADE) IMPLANT
BLADE EAR TYMPAN 2.5 STR BEAV (BLADE) IMPLANT
BLADE EYE SICKLE 84 5 BEAV (BLADE) IMPLANT
BLADE NEEDLE 3 SS STRL (BLADE) IMPLANT
BLADE SURG 10 STRL SS (BLADE) ×2 IMPLANT
BLADE SURG 15 STRL LF DISP TIS (BLADE) ×1 IMPLANT
BLADE SURG 15 STRL SS (BLADE) ×1
BLADE SURG ROTATE 9660 (MISCELLANEOUS) IMPLANT
BNDG CONFORM 3 STRL LF (GAUZE/BANDAGES/DRESSINGS) IMPLANT
BNDG GAUZE ELAST 4 BULKY (GAUZE/BANDAGES/DRESSINGS) IMPLANT
BUR SABER RD CUTTING 3.0 (BURR) ×2 IMPLANT
CANISTER SUCTION 2500CC (MISCELLANEOUS) ×2 IMPLANT
CLEANER TIP ELECTROSURG 2X2 (MISCELLANEOUS) ×2 IMPLANT
CORDS BIPOLAR (ELECTRODE) IMPLANT
COTTONBALL LRG STERILE PKG (GAUZE/BANDAGES/DRESSINGS) ×2 IMPLANT
COVER SURGICAL LIGHT HANDLE (MISCELLANEOUS) ×2 IMPLANT
DECANTER SPIKE VIAL GLASS SM (MISCELLANEOUS) ×2 IMPLANT
DRAPE EENT ADH APERT 31X51 STR (DRAPES) ×2 IMPLANT
DRAPE INCISE 23X17 IOBAN STRL (DRAPES)
DRAPE INCISE IOBAN 23X17 STRL (DRAPES) IMPLANT
DRAPE MICROSCOPE LEICA 54X105 (DRAPE) ×2 IMPLANT
DRAPE PROXIMA HALF (DRAPES) ×2 IMPLANT
DRESSING ADAPTIC 1/2  N-ADH (PACKING) ×2 IMPLANT
DRSG GLASSCOCK MASTOID ADT (GAUZE/BANDAGES/DRESSINGS) ×2 IMPLANT
DRSG GLASSCOCK MASTOID PED (GAUZE/BANDAGES/DRESSINGS) IMPLANT
DRSG TELFA 3X8 NADH (GAUZE/BANDAGES/DRESSINGS) ×2 IMPLANT
ELECT COATED BLADE 2.86 ST (ELECTRODE) ×2 IMPLANT
ELECT REM PT RETURN 9FT ADLT (ELECTROSURGICAL) ×2
ELECTRODE REM PT RTRN 9FT ADLT (ELECTROSURGICAL) ×1 IMPLANT
GAUZE SPONGE 4X4 16PLY XRAY LF (GAUZE/BANDAGES/DRESSINGS) IMPLANT
GLOVE BIO SURGEON STRL SZ7.5 (GLOVE) ×2 IMPLANT
GLOVE BIOGEL PI IND STRL 7.0 (GLOVE) ×1 IMPLANT
GLOVE BIOGEL PI IND STRL 8.5 (GLOVE) ×1 IMPLANT
GLOVE BIOGEL PI INDICATOR 7.0 (GLOVE) ×1
GLOVE BIOGEL PI INDICATOR 8.5 (GLOVE) ×1
GLOVE ECLIPSE 7.5 STRL STRAW (GLOVE) ×2 IMPLANT
GLOVE SURG SS PI 7.0 STRL IVOR (GLOVE) ×2 IMPLANT
GOWN STRL REUS W/ TWL LRG LVL3 (GOWN DISPOSABLE) ×2 IMPLANT
GOWN STRL REUS W/ TWL XL LVL3 (GOWN DISPOSABLE) ×3 IMPLANT
GOWN STRL REUS W/TWL LRG LVL3 (GOWN DISPOSABLE) ×2
GOWN STRL REUS W/TWL XL LVL3 (GOWN DISPOSABLE) ×3
KIT BASIN OR (CUSTOM PROCEDURE TRAY) ×2 IMPLANT
KIT ROOM TURNOVER OR (KITS) ×2 IMPLANT
NEEDLE 18GX1X1/2 (RX/OR ONLY) (NEEDLE) ×2 IMPLANT
NEEDLE 27GAX1X1/2 (NEEDLE) ×2 IMPLANT
NS IRRIG 1000ML POUR BTL (IV SOLUTION) ×2 IMPLANT
PAD ARMBOARD 7.5X6 YLW CONV (MISCELLANEOUS) ×4 IMPLANT
PENCIL FOOT CONTROL (ELECTRODE) ×2 IMPLANT
PUNCH BIOPSY 4MM (MISCELLANEOUS)
PUNCH BIOPSY DERMAL 6MM STRL (MISCELLANEOUS) ×2 IMPLANT
PUNCH BIOPSY DISP 4 (MISCELLANEOUS) IMPLANT
SPONGE SURGIFOAM ABS GEL 12-7 (HEMOSTASIS) ×2 IMPLANT
STRIP CLOSURE SKIN 1/2X4 (GAUZE/BANDAGES/DRESSINGS) IMPLANT
STRIP CLOSURE SKIN 1/4X4 (GAUZE/BANDAGES/DRESSINGS) IMPLANT
SUT BONE WAX W31G (SUTURE) IMPLANT
SUT CHROMIC 3 0 PS 2 (SUTURE) ×2 IMPLANT
SUT ETHILON 5 0 P 3 18 (SUTURE)
SUT NYLON ETHILON 5-0 P-3 1X18 (SUTURE) IMPLANT
SUT PLAIN 5 0 P 3 18 (SUTURE) IMPLANT
SUT VIC AB 3-0 FS2 27 (SUTURE) ×2 IMPLANT
SYR BULB 3OZ (MISCELLANEOUS) IMPLANT
SYR CONTROL 10ML LL (SYRINGE) ×2 IMPLANT
TOWEL OR 17X24 6PK STRL BLUE (TOWEL DISPOSABLE) ×2 IMPLANT
TRAY ENT MC OR (CUSTOM PROCEDURE TRAY) ×2 IMPLANT
TRAY FOLEY CATH 14FRSI W/METER (CATHETERS) IMPLANT
TUBING EXTENTION W/L.L. (IV SETS) ×2 IMPLANT
TUBING IRRIGATION (MISCELLANEOUS) ×2 IMPLANT
WATER STERILE IRR 1000ML POUR (IV SOLUTION) ×2 IMPLANT

## 2015-08-18 NOTE — Transfer of Care (Signed)
Immediate Anesthesia Transfer of Care Note  Patient: Thomas Mcconnell  Procedure(s) Performed: Procedure(s): REVISION OF A RIGHT MASTOIDECTOMY (Right)  Patient Location: PACU  Anesthesia Type:General  Level of Consciousness: awake, alert  and patient cooperative  Airway & Oxygen Therapy: Patient Spontanous Breathing and Patient connected to nasal cannula oxygen  Post-op Assessment: Report given to RN and Post -op Vital signs reviewed and stable  Post vital signs: Reviewed and stable  Last Vitals:  Filed Vitals:   08/18/15 0727 08/18/15 1108  BP: 123/62   Pulse: 54   Temp: 36.6 C 36.2 C  Resp: 16     Last Pain: There were no vitals filed for this visit.       Complications: No apparent anesthesia complications

## 2015-08-18 NOTE — Discharge Instructions (Signed)
Leave ear dressing in place until Tuesday's appointment.

## 2015-08-18 NOTE — Anesthesia Preprocedure Evaluation (Addendum)
Anesthesia Evaluation  Patient identified by MRN, date of birth, ID band Patient awake    Reviewed: Allergy & Precautions, NPO status , Patient's Chart, lab work & pertinent test results  Airway Mallampati: II  TM Distance: >3 FB Neck ROM: Full    Dental  (+) Edentulous Upper, Edentulous Lower   Pulmonary shortness of breath, pneumonia, COPD, Current Smoker,    Pulmonary exam normal breath sounds clear to auscultation       Cardiovascular hypertension, Pt. on medications Normal cardiovascular exam Rhythm:Regular Rate:Normal     Neuro/Psych negative neurological ROS  negative psych ROS   GI/Hepatic Neg liver ROS, GERD  Medicated,  Endo/Other  diabetes, Type 2, Oral Hypoglycemic Agents  Renal/GU Cr 1.37 K 3.9  negative genitourinary   Musculoskeletal  (+) Arthritis ,   Abdominal   Peds negative pediatric ROS (+)  Hematology negative hematology ROS (+)   Anesthesia Other Findings   Reproductive/Obstetrics negative OB ROS                           Anesthesia Physical Anesthesia Plan  ASA: III  Anesthesia Plan: General   Post-op Pain Management:    Induction: Intravenous  Airway Management Planned: Oral ETT  Additional Equipment:   Intra-op Plan:   Post-operative Plan: Extubation in OR  Informed Consent: I have reviewed the patients History and Physical, chart, labs and discussed the procedure including the risks, benefits and alternatives for the proposed anesthesia with the patient or authorized representative who has indicated his/her understanding and acceptance.   Dental advisory given  Plan Discussed with: CRNA  Anesthesia Plan Comments:        Anesthesia Quick Evaluation

## 2015-08-18 NOTE — Anesthesia Procedure Notes (Signed)
Procedure Name: Intubation Date/Time: 08/18/2015 9:25 AM Performed by: Salli Quarry Wilmont Olund Pre-anesthesia Checklist: Patient identified, Emergency Drugs available, Suction available and Patient being monitored Patient Re-evaluated:Patient Re-evaluated prior to inductionOxygen Delivery Method: Circle System Utilized Preoxygenation: Pre-oxygenation with 100% oxygen Intubation Type: IV induction Ventilation: Mask ventilation without difficulty Laryngoscope Size: Mac and 3 Grade View: Grade I Tube type: Oral Tube size: 7.5 mm Number of attempts: 1 Airway Equipment and Method: Stylet Placement Confirmation: ETT inserted through vocal cords under direct vision,  positive ETCO2 and breath sounds checked- equal and bilateral Secured at: 22 cm Tube secured with: Tape Dental Injury: Teeth and Oropharynx as per pre-operative assessment

## 2015-08-18 NOTE — Op Note (Signed)
OPERATIVE REPORT  DATE OF SURGERY: 08/18/2015  PATIENT:  Thomas Mcconnell,  82 y.o. male  PRE-OPERATIVE DIAGNOSIS:  right cholesteatoma   POST-OPERATIVE DIAGNOSIS:  right cholesteatoma   PROCEDURE:  Procedure(s): REVISION OF A RIGHT MASTOIDECTOMY  SURGEON:  Beckie Salts, MD  ASSISTANTS: none  ANESTHESIA:   General   EBL:  40 ml  DRAINS: none  LOCAL MEDICATIONS USED:  1% Xylocaine with epinephrine  SPECIMEN:  Right mastoid contents  COUNTS:  Correct  PROCEDURE DETAILS: The patient was taken to the operating room and placed on the operating table in the supine position. Following induction of general endotracheal anesthesia, the right ear was prepped and draped in a standard fashion. The operating microscope was brought into the operative field. The ear canal was inspected and cleaned of debris. There was a large amount of cholesteatoma debris in the ear canal. This was suctioned out. A T-tube was in place in the anterior inferior tympanic membrane. Local anesthetic solution was infiltrated into the superior and inferior incisura. 15 scalpel was used to make incisions in these 2 places. A Lempert speculum was used to inspect the canal and the mastoid. Posterior canal skin was dissected off of the posterior canal wall bone. The posterior canal wall bone was intact except for the medial aspect just lateral to the facial ridge was completely eroded away and there is cholesteatoma seen extending back into the mastoid. A 4 mm high speed drill was used to remove the remainder of the posterior canal wall and to smooth out the edges. This exposed a large cholesteatoma which was gently dissected free from the mastoid cavity. I was able to visualize almost the entire mastoid cavity using microscope. A 30 nasal endoscope was then used to visualize as far posterior as necessary in order to visualize the entire mastoid cavity. The remainder of the mastoid cavity was clear and smooth and covered and  epithelium. There was no residual cholesteatoma matrix identified and no additional erosion. The vertical fascial canal was never exposed. The otic capsule was not exposed. The mastoid cavity was packed with Ciprodex soaked Gelfoam pieces. A large meatoplasty was accomplished by removing additional soft tissue posterior to the meatus and removing a large piece of posterior canal skin. 3-0 Vicryl sutures were used to tack down the skin on itself to prevent closure by healing. The cavity was then packed with 12 feet half-inch Adaptic packing coated with bacitracin ointment. A Glasscock dressing was applied. Patient was awakened exudate and transferred to recovery in stable condition.    PATIENT DISPOSITION:  To PACU, stable

## 2015-08-18 NOTE — Anesthesia Postprocedure Evaluation (Signed)
Anesthesia Post Note  Patient: Thomas Mcconnell  Procedure(s) Performed: Procedure(s) (LRB): REVISION OF A RIGHT MASTOIDECTOMY (Right)  Patient location during evaluation: PACU Anesthesia Type: General Level of consciousness: awake and alert Pain management: pain level controlled Vital Signs Assessment: post-procedure vital signs reviewed and stable Respiratory status: spontaneous breathing, nonlabored ventilation, respiratory function stable and patient connected to nasal cannula oxygen Cardiovascular status: blood pressure returned to baseline and stable Postop Assessment: no signs of nausea or vomiting Anesthetic complications: no    Last Vitals:  Filed Vitals:   08/18/15 1156 08/18/15 1208  BP:  149/81  Pulse: 70 77  Temp: 36.1 C   Resp: 17     Last Pain:  Filed Vitals:   08/18/15 1217  PainSc: 0-No pain                 [name]

## 2015-08-18 NOTE — H&P (View-Only) (Signed)
  No new complaints. When he presses on his mastoid he gets vertiginous. On exam, there is a large cholesteatoma mass filling the ear canal and mastoid cavity. I was only able to remove small parts. I was unable to clean out the remainder. I reviewed the CT scan. There is erosion of the horizontal semicircular canal. The audiogram today reveals a severe to profound loss on the left and a complete profound loss on the right. There is clinical and radiographic evidence of otic capsule erosion. This is a dangerous situation and he is going to require a revision mastoidectomy. Since the hearing is completely tolerated were not worried about that. He may have acute vertigo postoperatively. We discussed that if we do not operate, he runs the risk of further injury or damage with further vertigo and potentially intracranial involvement. The surgery is intended to create a safe ear. We will go ahead and schedule him for this. He will need a pulmonary clearance.  Electronically signed by: Beckie Salts, MD 07/14/15 1044  Back to top of Progress Notes Thomas Mcconnell, AuD CCC-A - 07/14/2015 9:00 AM EDT Audiometric Results: Results were obtained using headphones; DNT with inserts due to right ear drainage. Reliability was good. Results indicate moderate sloping to severe sensorineural hearing loss for the left ear and a profound mixed hearing loss for the right ear. Bone conduction is symmetric. Word recognition was fair for left ear and CNT right ear due to SRT at 110dB.  Recommendations: Recommend repeat testing in conjunction with otologic care.  Continue use of current amplification, which may need to be louder after everything has resolved

## 2015-08-18 NOTE — Interval H&P Note (Signed)
History and Physical Interval Note:  08/18/2015 8:38 AM  Thomas Mcconnell  has presented today for surgery, with the diagnosis of right cholesteatoma   The various methods of treatment have been discussed with the patient and family. After consideration of risks, benefits and other options for treatment, the patient has consented to  Procedure(s): REVISION OF A RIGHT MASTOIDECTOMY (Right) as a surgical intervention .  The patient's history has been reviewed, patient examined, no change in status, stable for surgery.  I have reviewed the patient's chart and labs.  Questions were answered to the patient's satisfaction.     Keiera Strathman

## 2015-08-19 ENCOUNTER — Encounter (HOSPITAL_COMMUNITY): Payer: Self-pay | Admitting: Otolaryngology

## 2015-09-01 DIAGNOSIS — E1151 Type 2 diabetes mellitus with diabetic peripheral angiopathy without gangrene: Secondary | ICD-10-CM | POA: Diagnosis not present

## 2015-09-01 DIAGNOSIS — B351 Tinea unguium: Secondary | ICD-10-CM | POA: Diagnosis not present

## 2015-09-01 DIAGNOSIS — L84 Corns and callosities: Secondary | ICD-10-CM | POA: Diagnosis not present

## 2015-09-06 ENCOUNTER — Encounter: Payer: Self-pay | Admitting: Internal Medicine

## 2015-09-06 ENCOUNTER — Ambulatory Visit (INDEPENDENT_AMBULATORY_CARE_PROVIDER_SITE_OTHER): Payer: Medicare Other | Admitting: Internal Medicine

## 2015-09-06 VITALS — BP 112/60 | HR 76 | Ht 71.0 in | Wt 196.0 lb

## 2015-09-06 DIAGNOSIS — I10 Essential (primary) hypertension: Secondary | ICD-10-CM | POA: Diagnosis not present

## 2015-09-06 DIAGNOSIS — J449 Chronic obstructive pulmonary disease, unspecified: Secondary | ICD-10-CM

## 2015-09-06 MED ORDER — FAMOTIDINE 20 MG PO TABS: ORAL_TABLET | ORAL | Status: AC

## 2015-09-06 NOTE — Progress Notes (Addendum)
Subjective:    Patient ID: Thomas Mcconnell, male    DOB: [birth year],   MRN: [id number]    Brief patient profile:  27 yowm cigar smoker referred to pulmonary clinic 07/21/2015 by Dr Constance Holster for preop eval for ear surgery   History of Present Illness  07/21/2015 1st [facility] office visit/ [name]  maint rx ppi/ spiriva/qvar acei Chief Complaint  Patient presents with  . Pulmonary Consult    Referred by Dr. Constance Holster for pulmonary clearance for ear surgery. He states "I have bronchitis, but I am breathing okay". He c/o prod cough with min white sputum.  c/o cough/ congestion x 15 y worse in am / clears p one hour > No purulent sputum or mucus plugs   No better with saba Sense of choking on large pills "always" while on ACEi no better with ppi  Doe = MMRC1 = can walk nl pace, flat grade, can't hurry or go uphills or steps s sob   rec Stop lisinopril and qvar and spiriva Start diovan(valsartan) 80 mg daily  Plan A = Automatic = dulera 100 Take 2 puffs first thing in am and then another 2 puffs about 12 hours later.  Plan B = Backup Only use your albuterol as a rescue medication The key is to stop smoking completely before smoking completely stops you - it's clearly not too late    NP eval 08/04/15 rec It was nice to meet you today. We will send in a prescription for the [facility]. Use 2 puffs twice daily as your maintenance. Continue using that 2 puffs twice daily Continue using your Albuterol inhaler as you rescue for shortness of breath or wheezing every 6 hours as needed Continue your Valsartan for blood pressure as your blood pressure is well controlled today.  Depo Medrol injection today for wheezing.     09/06/2015  f/u ov/[name] re:  GOLD II copd/ ? Asthma much better off acei while on dulera 100 2bid despite timolol eyedrops  Chief Complaint  Patient presents with  . Follow-up    Breathing is unchanged. He is still coughing with white sputum. He has had less wheezing. He uses  rescue inhaler once every 2 wks on average.   really Not limited by breathing from desired activities   Immediate "wheeze" /cough/ at hs better on side  No obvious day to day or daytime variability or assoc excess/ purulent sputum or mucus plugs   or cp or chest tightness, subjective wheeze or overt sinus or hb symptoms. No unusual exp hx or h/o childhood pna/ asthma or knowledge of premature birth.  Sleeping ok without nocturnal  or early am exacerbation  of respiratory  c/o's or need for noct saba. Also denies any obvious fluctuation of symptoms with weather or environmental changes or other aggravating or alleviating factors except as outlined above   Current Medications, Allergies, Complete Past Medical History, Past Surgical History, Family History, and Social History were reviewed in [employer] record.  ROS  The following are not active complaints unless bolded sore throat, dysphagia, dental problems, itching, sneezing,  nasal congestion or excess/ purulent secretions, ear ache,   fever, chills, sweats, unintended wt loss, classically pleuritic or exertional cp, hemoptysis,  orthopnea pnd or leg swelling, presyncope, palpitations, abdominal pain, anorexia, nausea, vomiting, diarrhea  or change in bowel or bladder habits, change in stools or urine, dysuria,hematuria,  rash, arthralgias, visual complaints, headache, numbness, weakness or ataxia or problems with walking or coordination,  change in mood/affect or memory.                Objective:   Physical Exam   Amb  Wm nad/ very hard of hearing   09/06/2015        196   07/21/15 200 lb (90.719 kg)  03/16/15 198 lb (89.812 kg)  12/28/14 190 lb (86.183 kg)    Vital signs reviewed  HEENT: nl  turbinates, and oropharynx. Nl external ear canals without cough reflex- no upper teeth, two lower incisor remnants   NECK :  without JVD/Nodes/TM/ nl carotid upstrokes bilaterally   LUNGS: no acc muscle use,  Nl  contour chest with pseudowheeze only / eliminated with plm   CV:  RRR  no s3 or murmur or increase in P2, no edema   ABD:  soft and nontender with nl inspiratory excursion in the supine position. No bruits or organomegaly, bowel sounds nl  MS:  Nl gait/ ext warm without deformities, calf tenderness, cyanosis or clubbing No obvious joint restrictions   SKIN: warm and dry without lesions    NEURO:  alert, approp, nl sensorium with  no motor deficits     CXR PA and Lateral:   07/21/2015 :    I personally reviewed images and agree with radiology impression as follows:    There is no active cardiopulmonary disease.         Assessment & Plan:   Outpatient Encounter Prescriptions as of 09/06/2015  Medication Sig  . albuterol (PROVENTIL HFA;VENTOLIN HFA) 108 (90 BASE) MCG/ACT inhaler Inhale 2 puffs into the lungs daily.  Marland Kitchen aspirin EC 81 MG tablet Take 81 mg by mouth at bedtime.  . cholecalciferol (VITAMIN D) 1000 UNITS tablet Take 1,000 Units by mouth daily.  . ciprofloxacin-dexamethasone (CIPRODEX) otic suspension Place 3 drops into the right ear 3 (three) times daily.  . Clobetasol Prop Emollient Base (CLOBETASOL PROPIONATE E) 0.05 % emollient cream Apply topically 2 (two) times daily.  Marland Kitchen doxepin (SINEQUAN) 100 MG capsule Take 100 mg by mouth at bedtime.  Marland Kitchen FLUoxetine (PROZAC) 20 MG capsule Take 20 mg by mouth every morning.  Marland Kitchen guaiFENesin (MUCINEX) 600 MG 12 hr tablet Take by mouth 2 (two) times daily.  . hydrochlorothiazide (HYDRODIURIL) 25 MG tablet Take 25 mg by mouth every morning.  Marland Kitchen HYDROcodone-acetaminophen (NORCO) 7.5-325 MG tablet Take 1 tablet by mouth every 6 (six) hours as needed for moderate pain.  . meloxicam (MOBIC) 7.5 MG tablet Take 7.5 mg by mouth daily.  . metFORMIN (GLUCOPHAGE-XR) 500 MG 24 hr tablet Take 500 mg by mouth 3 (three) times daily.   . mometasone-formoterol (DULERA) 100-5 MCG/ACT AERO Take 2 puffs first thing in am and then another 2 puffs about 12  hours later.  Marland Kitchen omeprazole (PRILOSEC) 40 MG capsule Take 40 mg by mouth every morning.  . promethazine (PHENERGAN) 25 MG suppository Place 1 suppository (25 mg total) rectally every 8 (eight) hours as needed for nausea or vomiting.  . simvastatin (ZOCOR) 10 MG tablet Take 10 mg by mouth daily.  . timolol (BETIMOL) 0.25 % ophthalmic solution Place 1-2 drops into the left eye 2 (two) times daily.  . Travoprost, BAK Free, (TRAVATAN) 0.004 % SOLN ophthalmic solution Place 1 drop into the left eye at bedtime.  . valsartan (DIOVAN) 80 MG tablet Take 1 tablet (80 mg total) by mouth daily.  . famotidine (PEPCID) 20 MG tablet One at bedtime   No facility-administered encounter medications on file as of  09/06/2015.    

## 2015-09-06 NOTE — Patient Instructions (Addendum)
Try pepcid ac 20 mg at bedtime - take it consistently for at least a month to see if it helps your nighttime wheezing   GERD (REFLUX)  is an extremely common cause of respiratory symptoms just like yours , many times with no obvious heartburn at all.    It can be treated with medication, but also with lifestyle changes including elevation of the head of your bed (ideally with 6 inch  bed blocks),  Smoking cessation, avoidance of late meals, excessive alcohol, and avoid fatty foods, chocolate, peppermint, colas, red wine, and acidic juices such as orange juice.  NO MINT OR MENTHOL PRODUCTS SO NO COUGH DROPS  USE SUGARLESS CANDY INSTEAD (Jolley ranchers or Stover's or Life Savers) or even ice chips will also do - the key is to swallow to prevent all throat clearing. NO OIL BASED VITAMINS - use powdered substitutes.     If you are satisfied with your treatment plan,  let your doctor know and he/she can either refill your medications or you can return here when your prescription runs out.     If in any way you are not 100% satisfied,  please tell us.  If 100% better, tell your friends!  Pulmonary follow up is as needed  Late add:  Advised to see opth  about changing out timolol if wheezing continues

## 2015-09-06 NOTE — Assessment & Plan Note (Signed)
Try off acei 07/21/2015 due to cough / pseudowheeze > 90% better 09/06/2015   Although even in retrospect it may not be clear the ACEi contributed to the pt's symptoms,  Pt improved off them and adding them back at this point or in the future would risk confusion in interpretation of non-specific respiratory symptoms to which this patient is prone  ie  Better not to muddy the waters here.   bp if fine on diovan 80 mg daily > no change rx > Follow up per Primary Care planned

## 2015-09-06 NOTE — Assessment & Plan Note (Addendum)
07/21/2015    try dulera 100 2bid as main complaint is cough/ not sob - Spirometry 07/21/2015  FEV1 2.35 (70%)  Ratio 63  - 09/06/2015  After extensive coaching HFA effectiveness =    90% > try dulera 100 2bid prn    I had an extended final summary discussion with the patient reviewing all relevant studies completed to date and  lasting 15 to 20 minutes of a 25 minute visit on the following issues:    He is much better off acei and only minimal pseudowheeze which is just as likely from gerd as from primary airways dz so rec max acid suppression/ gerd diet and off dulera 100 2 bid to see what difference if any this makes and  f/u with PC if better, here if not (next step would be a trial off timolol eyedrops in favor of a more selective agent like Betoptic but defer this to opth)   Each maintenance medication was reviewed in detail including most importantly the difference between maintenance and prns and under what circumstances the prns are to be triggered using an action plan format that is not reflected in the computer generated alphabetically organized AVS.    Please see instructions for details which were reviewed in writing and the patient given a copy highlighting the part that I personally wrote and discussed at today's ov.

## 2015-09-07 ENCOUNTER — Telehealth: Payer: Self-pay | Admitting: *Deleted

## 2015-09-07 NOTE — Telephone Encounter (Signed)
LMTCB

## 2015-09-07 NOTE — Telephone Encounter (Signed)
-----   Message from Tanda Rockers, MD sent at 09/07/2015  6:01 AM EDT ----- Need to consider changing eyedrops so ask him who his eye doctor is and send him a copy of last ov

## 2015-09-08 NOTE — Telephone Encounter (Signed)
[phone] Thomas Mcconnell calling back

## 2015-09-08 NOTE — Telephone Encounter (Signed)
Spoke with the pt and notified of recs per MW  He verbalized understanding His ophthalmologist is Dr Julian Reil I have sent him a copy of last ov

## 2015-11-02 DIAGNOSIS — E1151 Type 2 diabetes mellitus with diabetic peripheral angiopathy without gangrene: Secondary | ICD-10-CM | POA: Diagnosis not present

## 2015-11-02 DIAGNOSIS — I10 Essential (primary) hypertension: Secondary | ICD-10-CM | POA: Diagnosis not present

## 2015-11-02 DIAGNOSIS — Z7984 Long term (current) use of oral hypoglycemic drugs: Secondary | ICD-10-CM | POA: Diagnosis not present

## 2015-11-02 DIAGNOSIS — J449 Chronic obstructive pulmonary disease, unspecified: Secondary | ICD-10-CM | POA: Diagnosis not present

## 2015-11-02 DIAGNOSIS — I739 Peripheral vascular disease, unspecified: Secondary | ICD-10-CM | POA: Diagnosis not present

## 2015-11-02 DIAGNOSIS — Z6828 Body mass index (BMI) 28.0-28.9, adult: Secondary | ICD-10-CM | POA: Diagnosis not present

## 2015-11-02 DIAGNOSIS — E6609 Other obesity due to excess calories: Secondary | ICD-10-CM | POA: Diagnosis not present

## 2015-11-10 DIAGNOSIS — B351 Tinea unguium: Secondary | ICD-10-CM | POA: Diagnosis not present

## 2015-11-10 DIAGNOSIS — L84 Corns and callosities: Secondary | ICD-10-CM | POA: Diagnosis not present

## 2015-11-10 DIAGNOSIS — E1151 Type 2 diabetes mellitus with diabetic peripheral angiopathy without gangrene: Secondary | ICD-10-CM | POA: Diagnosis not present

## 2015-12-06 DIAGNOSIS — H7011 Chronic mastoiditis, right ear: Secondary | ICD-10-CM | POA: Diagnosis not present

## 2015-12-21 DIAGNOSIS — H26491 Other secondary cataract, right eye: Secondary | ICD-10-CM | POA: Diagnosis not present

## 2015-12-21 DIAGNOSIS — H401131 Primary open-angle glaucoma, bilateral, mild stage: Secondary | ICD-10-CM | POA: Diagnosis not present

## 2015-12-23 DIAGNOSIS — H7011 Chronic mastoiditis, right ear: Secondary | ICD-10-CM | POA: Diagnosis not present

## 2016-01-10 DIAGNOSIS — H7011 Chronic mastoiditis, right ear: Secondary | ICD-10-CM | POA: Diagnosis not present

## 2016-01-19 DIAGNOSIS — B351 Tinea unguium: Secondary | ICD-10-CM | POA: Diagnosis not present

## 2016-01-19 DIAGNOSIS — L84 Corns and callosities: Secondary | ICD-10-CM | POA: Diagnosis not present

## 2016-01-19 DIAGNOSIS — E1151 Type 2 diabetes mellitus with diabetic peripheral angiopathy without gangrene: Secondary | ICD-10-CM | POA: Diagnosis not present

## 2016-01-27 DIAGNOSIS — J441 Chronic obstructive pulmonary disease with (acute) exacerbation: Secondary | ICD-10-CM | POA: Diagnosis not present

## 2016-01-27 DIAGNOSIS — E1151 Type 2 diabetes mellitus with diabetic peripheral angiopathy without gangrene: Secondary | ICD-10-CM | POA: Diagnosis not present

## 2016-01-27 DIAGNOSIS — I10 Essential (primary) hypertension: Secondary | ICD-10-CM | POA: Diagnosis not present

## 2016-01-27 DIAGNOSIS — F339 Major depressive disorder, recurrent, unspecified: Secondary | ICD-10-CM | POA: Diagnosis not present

## 2016-01-27 DIAGNOSIS — Z125 Encounter for screening for malignant neoplasm of prostate: Secondary | ICD-10-CM | POA: Diagnosis not present

## 2016-01-27 DIAGNOSIS — E785 Hyperlipidemia, unspecified: Secondary | ICD-10-CM | POA: Diagnosis not present

## 2016-01-27 DIAGNOSIS — Z86010 Personal history of colonic polyps: Secondary | ICD-10-CM | POA: Diagnosis not present

## 2016-01-27 DIAGNOSIS — Z0000 Encounter for general adult medical examination without abnormal findings: Secondary | ICD-10-CM | POA: Diagnosis not present

## 2016-01-27 DIAGNOSIS — J449 Chronic obstructive pulmonary disease, unspecified: Secondary | ICD-10-CM | POA: Diagnosis not present

## 2016-01-27 DIAGNOSIS — K219 Gastro-esophageal reflux disease without esophagitis: Secondary | ICD-10-CM | POA: Diagnosis not present

## 2016-02-14 DIAGNOSIS — H7011 Chronic mastoiditis, right ear: Secondary | ICD-10-CM | POA: Diagnosis not present

## 2016-03-20 DIAGNOSIS — H6091 Unspecified otitis externa, right ear: Secondary | ICD-10-CM | POA: Diagnosis not present

## 2016-03-29 DIAGNOSIS — L84 Corns and callosities: Secondary | ICD-10-CM | POA: Diagnosis not present

## 2016-03-29 DIAGNOSIS — E1142 Type 2 diabetes mellitus with diabetic polyneuropathy: Secondary | ICD-10-CM | POA: Diagnosis not present

## 2016-03-29 DIAGNOSIS — B351 Tinea unguium: Secondary | ICD-10-CM | POA: Diagnosis not present

## 2016-04-24 DIAGNOSIS — H7011 Chronic mastoiditis, right ear: Secondary | ICD-10-CM | POA: Diagnosis not present

## 2016-06-12 DIAGNOSIS — H7011 Chronic mastoiditis, right ear: Secondary | ICD-10-CM | POA: Diagnosis not present

## 2016-06-21 DIAGNOSIS — L84 Corns and callosities: Secondary | ICD-10-CM | POA: Diagnosis not present

## 2016-06-21 DIAGNOSIS — B351 Tinea unguium: Secondary | ICD-10-CM | POA: Diagnosis not present

## 2016-06-21 DIAGNOSIS — E1142 Type 2 diabetes mellitus with diabetic polyneuropathy: Secondary | ICD-10-CM | POA: Diagnosis not present

## 2016-07-05 DIAGNOSIS — H401131 Primary open-angle glaucoma, bilateral, mild stage: Secondary | ICD-10-CM | POA: Diagnosis not present

## 2016-07-17 DIAGNOSIS — H7011 Chronic mastoiditis, right ear: Secondary | ICD-10-CM | POA: Diagnosis not present

## 2016-08-01 DIAGNOSIS — E785 Hyperlipidemia, unspecified: Secondary | ICD-10-CM | POA: Diagnosis not present

## 2016-08-01 DIAGNOSIS — I10 Essential (primary) hypertension: Secondary | ICD-10-CM | POA: Diagnosis not present

## 2016-08-01 DIAGNOSIS — F339 Major depressive disorder, recurrent, unspecified: Secondary | ICD-10-CM | POA: Diagnosis not present

## 2016-08-01 DIAGNOSIS — E1151 Type 2 diabetes mellitus with diabetic peripheral angiopathy without gangrene: Secondary | ICD-10-CM | POA: Diagnosis not present

## 2016-08-16 DIAGNOSIS — H7091 Unspecified mastoiditis, right ear: Secondary | ICD-10-CM | POA: Diagnosis not present

## 2016-08-23 DIAGNOSIS — J449 Chronic obstructive pulmonary disease, unspecified: Secondary | ICD-10-CM | POA: Diagnosis not present

## 2016-08-30 DIAGNOSIS — B351 Tinea unguium: Secondary | ICD-10-CM | POA: Diagnosis not present

## 2016-08-30 DIAGNOSIS — E1142 Type 2 diabetes mellitus with diabetic polyneuropathy: Secondary | ICD-10-CM | POA: Diagnosis not present

## 2016-08-30 DIAGNOSIS — L84 Corns and callosities: Secondary | ICD-10-CM | POA: Diagnosis not present

## 2016-08-30 DIAGNOSIS — M79676 Pain in unspecified toe(s): Secondary | ICD-10-CM | POA: Diagnosis not present

## 2016-09-18 DIAGNOSIS — H7011 Chronic mastoiditis, right ear: Secondary | ICD-10-CM | POA: Diagnosis not present

## 2016-10-18 DIAGNOSIS — H6091 Unspecified otitis externa, right ear: Secondary | ICD-10-CM | POA: Diagnosis not present

## 2016-11-01 DIAGNOSIS — E1151 Type 2 diabetes mellitus with diabetic peripheral angiopathy without gangrene: Secondary | ICD-10-CM | POA: Diagnosis not present

## 2016-11-01 DIAGNOSIS — R200 Anesthesia of skin: Secondary | ICD-10-CM | POA: Diagnosis not present

## 2016-11-01 DIAGNOSIS — Z7984 Long term (current) use of oral hypoglycemic drugs: Secondary | ICD-10-CM | POA: Diagnosis not present

## 2016-11-01 DIAGNOSIS — L209 Atopic dermatitis, unspecified: Secondary | ICD-10-CM | POA: Diagnosis not present

## 2016-11-15 DIAGNOSIS — B351 Tinea unguium: Secondary | ICD-10-CM | POA: Diagnosis not present

## 2016-11-15 DIAGNOSIS — E1142 Type 2 diabetes mellitus with diabetic polyneuropathy: Secondary | ICD-10-CM | POA: Diagnosis not present

## 2016-11-15 DIAGNOSIS — L84 Corns and callosities: Secondary | ICD-10-CM | POA: Diagnosis not present

## 2016-11-15 DIAGNOSIS — M79676 Pain in unspecified toe(s): Secondary | ICD-10-CM | POA: Diagnosis not present

## 2016-11-23 DIAGNOSIS — H7011 Chronic mastoiditis, right ear: Secondary | ICD-10-CM | POA: Diagnosis not present

## 2016-11-26 ENCOUNTER — Other Ambulatory Visit: Payer: Self-pay | Admitting: Internal Medicine

## 2016-11-26 ENCOUNTER — Ambulatory Visit
Admission: RE | Admit: 2016-11-26 | Discharge: 2016-11-26 | Disposition: A | Discharge status: Home / Self Care | Payer: Medicare Other | Source: Ambulatory Visit | Attending: Internal Medicine | Admitting: Internal Medicine

## 2016-11-26 DIAGNOSIS — M25511 Pain in right shoulder: Secondary | ICD-10-CM | POA: Diagnosis not present

## 2016-11-26 DIAGNOSIS — S4991XA Unspecified injury of right shoulder and upper arm, initial encounter: Secondary | ICD-10-CM | POA: Diagnosis not present

## 2016-11-26 DIAGNOSIS — W19XXXA Unspecified fall, initial encounter: Secondary | ICD-10-CM | POA: Diagnosis not present

## 2016-11-26 DIAGNOSIS — J449 Chronic obstructive pulmonary disease, unspecified: Secondary | ICD-10-CM | POA: Diagnosis not present

## 2016-11-26 DIAGNOSIS — Z9181 History of falling: Secondary | ICD-10-CM | POA: Diagnosis not present

## 2016-12-24 DIAGNOSIS — H7091 Unspecified mastoiditis, right ear: Secondary | ICD-10-CM | POA: Diagnosis not present

## 2017-01-11 DIAGNOSIS — H26492 Other secondary cataract, left eye: Secondary | ICD-10-CM | POA: Diagnosis not present

## 2017-01-11 DIAGNOSIS — H401131 Primary open-angle glaucoma, bilateral, mild stage: Secondary | ICD-10-CM | POA: Diagnosis not present

## 2017-01-24 DIAGNOSIS — L84 Corns and callosities: Secondary | ICD-10-CM | POA: Diagnosis not present

## 2017-01-24 DIAGNOSIS — M79676 Pain in unspecified toe(s): Secondary | ICD-10-CM | POA: Diagnosis not present

## 2017-01-24 DIAGNOSIS — E1142 Type 2 diabetes mellitus with diabetic polyneuropathy: Secondary | ICD-10-CM | POA: Diagnosis not present

## 2017-01-24 DIAGNOSIS — B351 Tinea unguium: Secondary | ICD-10-CM | POA: Diagnosis not present

## 2017-01-25 DIAGNOSIS — H7091 Unspecified mastoiditis, right ear: Secondary | ICD-10-CM | POA: Diagnosis not present

## 2017-01-28 DIAGNOSIS — I10 Essential (primary) hypertension: Secondary | ICD-10-CM | POA: Diagnosis not present

## 2017-01-28 DIAGNOSIS — F339 Major depressive disorder, recurrent, unspecified: Secondary | ICD-10-CM | POA: Diagnosis not present

## 2017-01-28 DIAGNOSIS — J449 Chronic obstructive pulmonary disease, unspecified: Secondary | ICD-10-CM | POA: Diagnosis not present

## 2017-01-28 DIAGNOSIS — Z0000 Encounter for general adult medical examination without abnormal findings: Secondary | ICD-10-CM | POA: Diagnosis not present

## 2017-01-29 DIAGNOSIS — D649 Anemia, unspecified: Secondary | ICD-10-CM | POA: Diagnosis not present

## 2017-03-21 DIAGNOSIS — H7091 Unspecified mastoiditis, right ear: Secondary | ICD-10-CM | POA: Diagnosis not present

## 2017-04-04 DIAGNOSIS — E1142 Type 2 diabetes mellitus with diabetic polyneuropathy: Secondary | ICD-10-CM | POA: Diagnosis not present

## 2017-04-04 DIAGNOSIS — M79676 Pain in unspecified toe(s): Secondary | ICD-10-CM | POA: Diagnosis not present

## 2017-04-04 DIAGNOSIS — L84 Corns and callosities: Secondary | ICD-10-CM | POA: Diagnosis not present

## 2017-04-04 DIAGNOSIS — B351 Tinea unguium: Secondary | ICD-10-CM | POA: Diagnosis not present

## 2017-05-28 ENCOUNTER — Other Ambulatory Visit: Payer: Self-pay | Admitting: Internal Medicine

## 2017-05-28 ENCOUNTER — Ambulatory Visit
Admission: RE | Admit: 2017-05-28 | Discharge: 2017-05-28 | Disposition: A | Discharge status: Home / Self Care | Payer: Medicare Other | Source: Ambulatory Visit | Attending: Internal Medicine | Admitting: Internal Medicine

## 2017-05-28 DIAGNOSIS — E1151 Type 2 diabetes mellitus with diabetic peripheral angiopathy without gangrene: Secondary | ICD-10-CM

## 2017-05-28 DIAGNOSIS — J449 Chronic obstructive pulmonary disease, unspecified: Secondary | ICD-10-CM | POA: Diagnosis not present

## 2017-05-28 DIAGNOSIS — M7989 Other specified soft tissue disorders: Secondary | ICD-10-CM | POA: Diagnosis not present

## 2017-05-28 DIAGNOSIS — Z23 Encounter for immunization: Secondary | ICD-10-CM | POA: Diagnosis not present

## 2017-05-28 DIAGNOSIS — D508 Other iron deficiency anemias: Secondary | ICD-10-CM | POA: Diagnosis not present

## 2017-05-28 DIAGNOSIS — S6991XA Unspecified injury of right wrist, hand and finger(s), initial encounter: Secondary | ICD-10-CM | POA: Diagnosis not present

## 2017-05-28 DIAGNOSIS — I10 Essential (primary) hypertension: Secondary | ICD-10-CM | POA: Diagnosis not present

## 2017-06-13 DIAGNOSIS — E1142 Type 2 diabetes mellitus with diabetic polyneuropathy: Secondary | ICD-10-CM | POA: Diagnosis not present

## 2017-06-13 DIAGNOSIS — M79676 Pain in unspecified toe(s): Secondary | ICD-10-CM | POA: Diagnosis not present

## 2017-06-13 DIAGNOSIS — L84 Corns and callosities: Secondary | ICD-10-CM | POA: Diagnosis not present

## 2017-06-13 DIAGNOSIS — B351 Tinea unguium: Secondary | ICD-10-CM | POA: Diagnosis not present

## 2017-07-15 DIAGNOSIS — H401131 Primary open-angle glaucoma, bilateral, mild stage: Secondary | ICD-10-CM | POA: Diagnosis not present

## 2017-08-22 DIAGNOSIS — M79676 Pain in unspecified toe(s): Secondary | ICD-10-CM | POA: Diagnosis not present

## 2017-08-22 DIAGNOSIS — E1142 Type 2 diabetes mellitus with diabetic polyneuropathy: Secondary | ICD-10-CM | POA: Diagnosis not present

## 2017-08-22 DIAGNOSIS — B351 Tinea unguium: Secondary | ICD-10-CM | POA: Diagnosis not present

## 2017-08-22 DIAGNOSIS — L84 Corns and callosities: Secondary | ICD-10-CM | POA: Diagnosis not present

## 2017-09-03 DIAGNOSIS — E119 Type 2 diabetes mellitus without complications: Secondary | ICD-10-CM | POA: Diagnosis not present

## 2017-09-20 DIAGNOSIS — H7091 Unspecified mastoiditis, right ear: Secondary | ICD-10-CM | POA: Diagnosis not present

## 2017-10-01 DIAGNOSIS — E1151 Type 2 diabetes mellitus with diabetic peripheral angiopathy without gangrene: Secondary | ICD-10-CM | POA: Diagnosis not present

## 2017-10-01 DIAGNOSIS — F17210 Nicotine dependence, cigarettes, uncomplicated: Secondary | ICD-10-CM | POA: Diagnosis not present

## 2017-10-01 DIAGNOSIS — R42 Dizziness and giddiness: Secondary | ICD-10-CM | POA: Diagnosis not present

## 2017-10-01 DIAGNOSIS — I10 Essential (primary) hypertension: Secondary | ICD-10-CM | POA: Diagnosis not present

## 2017-11-07 DIAGNOSIS — B351 Tinea unguium: Secondary | ICD-10-CM | POA: Diagnosis not present

## 2017-11-07 DIAGNOSIS — E1142 Type 2 diabetes mellitus with diabetic polyneuropathy: Secondary | ICD-10-CM | POA: Diagnosis not present

## 2017-11-07 DIAGNOSIS — M79676 Pain in unspecified toe(s): Secondary | ICD-10-CM | POA: Diagnosis not present

## 2017-11-07 DIAGNOSIS — L84 Corns and callosities: Secondary | ICD-10-CM | POA: Diagnosis not present

## 2018-01-13 DIAGNOSIS — Z23 Encounter for immunization: Secondary | ICD-10-CM | POA: Diagnosis not present

## 2018-01-14 DIAGNOSIS — H401131 Primary open-angle glaucoma, bilateral, mild stage: Secondary | ICD-10-CM | POA: Diagnosis not present

## 2018-01-23 DIAGNOSIS — E1142 Type 2 diabetes mellitus with diabetic polyneuropathy: Secondary | ICD-10-CM | POA: Diagnosis not present

## 2018-01-23 DIAGNOSIS — L84 Corns and callosities: Secondary | ICD-10-CM | POA: Diagnosis not present

## 2018-01-23 DIAGNOSIS — B351 Tinea unguium: Secondary | ICD-10-CM | POA: Diagnosis not present

## 2018-01-23 DIAGNOSIS — M79676 Pain in unspecified toe(s): Secondary | ICD-10-CM | POA: Diagnosis not present

## 2018-01-30 DIAGNOSIS — Z0000 Encounter for general adult medical examination without abnormal findings: Secondary | ICD-10-CM | POA: Diagnosis not present

## 2018-01-30 DIAGNOSIS — F339 Major depressive disorder, recurrent, unspecified: Secondary | ICD-10-CM | POA: Diagnosis not present

## 2018-01-30 DIAGNOSIS — F17200 Nicotine dependence, unspecified, uncomplicated: Secondary | ICD-10-CM | POA: Diagnosis not present

## 2018-01-30 DIAGNOSIS — I10 Essential (primary) hypertension: Secondary | ICD-10-CM | POA: Diagnosis not present

## 2018-04-10 DIAGNOSIS — M79676 Pain in unspecified toe(s): Secondary | ICD-10-CM | POA: Diagnosis not present

## 2018-04-10 DIAGNOSIS — E1142 Type 2 diabetes mellitus with diabetic polyneuropathy: Secondary | ICD-10-CM | POA: Diagnosis not present

## 2018-04-10 DIAGNOSIS — L84 Corns and callosities: Secondary | ICD-10-CM | POA: Diagnosis not present

## 2018-04-10 DIAGNOSIS — B351 Tinea unguium: Secondary | ICD-10-CM | POA: Diagnosis not present

## 2018-06-02 DIAGNOSIS — I10 Essential (primary) hypertension: Secondary | ICD-10-CM | POA: Diagnosis not present

## 2018-06-02 DIAGNOSIS — K219 Gastro-esophageal reflux disease without esophagitis: Secondary | ICD-10-CM | POA: Diagnosis not present

## 2018-06-02 DIAGNOSIS — R635 Abnormal weight gain: Secondary | ICD-10-CM | POA: Diagnosis not present

## 2018-06-02 DIAGNOSIS — E1151 Type 2 diabetes mellitus with diabetic peripheral angiopathy without gangrene: Secondary | ICD-10-CM | POA: Diagnosis not present

## 2018-06-19 DIAGNOSIS — L84 Corns and callosities: Secondary | ICD-10-CM | POA: Diagnosis not present

## 2018-06-19 DIAGNOSIS — B351 Tinea unguium: Secondary | ICD-10-CM | POA: Diagnosis not present

## 2018-06-19 DIAGNOSIS — E1142 Type 2 diabetes mellitus with diabetic polyneuropathy: Secondary | ICD-10-CM | POA: Diagnosis not present

## 2018-06-19 DIAGNOSIS — M79676 Pain in unspecified toe(s): Secondary | ICD-10-CM | POA: Diagnosis not present

## 2018-08-28 DIAGNOSIS — L84 Corns and callosities: Secondary | ICD-10-CM | POA: Diagnosis not present

## 2018-08-28 DIAGNOSIS — M79676 Pain in unspecified toe(s): Secondary | ICD-10-CM | POA: Diagnosis not present

## 2018-08-28 DIAGNOSIS — B351 Tinea unguium: Secondary | ICD-10-CM | POA: Diagnosis not present

## 2018-08-28 DIAGNOSIS — E1142 Type 2 diabetes mellitus with diabetic polyneuropathy: Secondary | ICD-10-CM | POA: Diagnosis not present

## 2018-09-29 DIAGNOSIS — S80869A Insect bite (nonvenomous), unspecified lower leg, initial encounter: Secondary | ICD-10-CM | POA: Diagnosis not present

## 2018-09-29 DIAGNOSIS — I10 Essential (primary) hypertension: Secondary | ICD-10-CM | POA: Diagnosis not present

## 2018-09-29 DIAGNOSIS — F17200 Nicotine dependence, unspecified, uncomplicated: Secondary | ICD-10-CM | POA: Diagnosis not present

## 2018-09-29 DIAGNOSIS — E1151 Type 2 diabetes mellitus with diabetic peripheral angiopathy without gangrene: Secondary | ICD-10-CM | POA: Diagnosis not present

## 2018-10-06 DIAGNOSIS — J441 Chronic obstructive pulmonary disease with (acute) exacerbation: Secondary | ICD-10-CM | POA: Diagnosis not present

## 2018-10-06 DIAGNOSIS — I10 Essential (primary) hypertension: Secondary | ICD-10-CM | POA: Diagnosis not present

## 2018-10-06 DIAGNOSIS — J449 Chronic obstructive pulmonary disease, unspecified: Secondary | ICD-10-CM | POA: Diagnosis not present

## 2018-10-06 DIAGNOSIS — E785 Hyperlipidemia, unspecified: Secondary | ICD-10-CM | POA: Diagnosis not present

## 2018-10-08 DIAGNOSIS — H903 Sensorineural hearing loss, bilateral: Secondary | ICD-10-CM | POA: Diagnosis not present

## 2018-10-17 DIAGNOSIS — H401131 Primary open-angle glaucoma, bilateral, mild stage: Secondary | ICD-10-CM | POA: Diagnosis not present

## 2018-10-23 DIAGNOSIS — E1151 Type 2 diabetes mellitus with diabetic peripheral angiopathy without gangrene: Secondary | ICD-10-CM | POA: Diagnosis not present

## 2018-11-07 DIAGNOSIS — E785 Hyperlipidemia, unspecified: Secondary | ICD-10-CM | POA: Diagnosis not present

## 2018-11-07 DIAGNOSIS — F339 Major depressive disorder, recurrent, unspecified: Secondary | ICD-10-CM | POA: Diagnosis not present

## 2018-11-07 DIAGNOSIS — I10 Essential (primary) hypertension: Secondary | ICD-10-CM | POA: Diagnosis not present

## 2018-11-07 DIAGNOSIS — J449 Chronic obstructive pulmonary disease, unspecified: Secondary | ICD-10-CM | POA: Diagnosis not present

## 2018-11-13 DIAGNOSIS — L84 Corns and callosities: Secondary | ICD-10-CM | POA: Diagnosis not present

## 2018-11-13 DIAGNOSIS — E1142 Type 2 diabetes mellitus with diabetic polyneuropathy: Secondary | ICD-10-CM | POA: Diagnosis not present

## 2018-11-13 DIAGNOSIS — B351 Tinea unguium: Secondary | ICD-10-CM | POA: Diagnosis not present

## 2018-11-13 DIAGNOSIS — M79676 Pain in unspecified toe(s): Secondary | ICD-10-CM | POA: Diagnosis not present

## 2018-11-18 DIAGNOSIS — H401131 Primary open-angle glaucoma, bilateral, mild stage: Secondary | ICD-10-CM | POA: Diagnosis not present

## 2018-12-29 DIAGNOSIS — H401131 Primary open-angle glaucoma, bilateral, mild stage: Secondary | ICD-10-CM | POA: Diagnosis not present

## 2019-01-14 DIAGNOSIS — W57XXXS Bitten or stung by nonvenomous insect and other nonvenomous arthropods, sequela: Secondary | ICD-10-CM | POA: Diagnosis not present

## 2019-01-14 DIAGNOSIS — Z23 Encounter for immunization: Secondary | ICD-10-CM | POA: Diagnosis not present

## 2019-01-14 DIAGNOSIS — I10 Essential (primary) hypertension: Secondary | ICD-10-CM | POA: Diagnosis not present

## 2019-01-14 DIAGNOSIS — R5383 Other fatigue: Secondary | ICD-10-CM | POA: Diagnosis not present

## 2019-01-14 DIAGNOSIS — E1151 Type 2 diabetes mellitus with diabetic peripheral angiopathy without gangrene: Secondary | ICD-10-CM | POA: Diagnosis not present

## 2019-01-14 DIAGNOSIS — S50862A Insect bite (nonvenomous) of left forearm, initial encounter: Secondary | ICD-10-CM | POA: Diagnosis not present

## 2019-01-22 DIAGNOSIS — L84 Corns and callosities: Secondary | ICD-10-CM | POA: Diagnosis not present

## 2019-01-22 DIAGNOSIS — B351 Tinea unguium: Secondary | ICD-10-CM | POA: Diagnosis not present

## 2019-01-22 DIAGNOSIS — M79676 Pain in unspecified toe(s): Secondary | ICD-10-CM | POA: Diagnosis not present

## 2019-01-22 DIAGNOSIS — E1142 Type 2 diabetes mellitus with diabetic polyneuropathy: Secondary | ICD-10-CM | POA: Diagnosis not present

## 2019-02-10 DIAGNOSIS — J449 Chronic obstructive pulmonary disease, unspecified: Secondary | ICD-10-CM | POA: Diagnosis not present

## 2019-02-10 DIAGNOSIS — F339 Major depressive disorder, recurrent, unspecified: Secondary | ICD-10-CM | POA: Diagnosis not present

## 2019-02-10 DIAGNOSIS — E1151 Type 2 diabetes mellitus with diabetic peripheral angiopathy without gangrene: Secondary | ICD-10-CM | POA: Diagnosis not present

## 2019-02-10 DIAGNOSIS — Z0000 Encounter for general adult medical examination without abnormal findings: Secondary | ICD-10-CM | POA: Diagnosis not present

## 2019-04-09 DIAGNOSIS — E1142 Type 2 diabetes mellitus with diabetic polyneuropathy: Secondary | ICD-10-CM | POA: Diagnosis not present

## 2019-04-09 DIAGNOSIS — B351 Tinea unguium: Secondary | ICD-10-CM | POA: Diagnosis not present

## 2019-04-09 DIAGNOSIS — L84 Corns and callosities: Secondary | ICD-10-CM | POA: Diagnosis not present

## 2019-04-09 DIAGNOSIS — M79676 Pain in unspecified toe(s): Secondary | ICD-10-CM | POA: Diagnosis not present

## 2019-04-27 DIAGNOSIS — H401132 Primary open-angle glaucoma, bilateral, moderate stage: Secondary | ICD-10-CM | POA: Diagnosis not present

## 2019-05-04 DIAGNOSIS — H401132 Primary open-angle glaucoma, bilateral, moderate stage: Secondary | ICD-10-CM | POA: Diagnosis not present

## 2019-06-04 DIAGNOSIS — J441 Chronic obstructive pulmonary disease with (acute) exacerbation: Secondary | ICD-10-CM | POA: Diagnosis not present

## 2019-06-04 DIAGNOSIS — I10 Essential (primary) hypertension: Secondary | ICD-10-CM | POA: Diagnosis not present

## 2019-06-04 DIAGNOSIS — E785 Hyperlipidemia, unspecified: Secondary | ICD-10-CM | POA: Diagnosis not present

## 2019-06-04 DIAGNOSIS — J449 Chronic obstructive pulmonary disease, unspecified: Secondary | ICD-10-CM | POA: Diagnosis not present

## 2019-06-10 DIAGNOSIS — F17200 Nicotine dependence, unspecified, uncomplicated: Secondary | ICD-10-CM | POA: Diagnosis not present

## 2019-06-10 DIAGNOSIS — I10 Essential (primary) hypertension: Secondary | ICD-10-CM | POA: Diagnosis not present

## 2019-06-10 DIAGNOSIS — E1151 Type 2 diabetes mellitus with diabetic peripheral angiopathy without gangrene: Secondary | ICD-10-CM | POA: Diagnosis not present

## 2019-06-18 DIAGNOSIS — M79676 Pain in unspecified toe(s): Secondary | ICD-10-CM | POA: Diagnosis not present

## 2019-06-18 DIAGNOSIS — B351 Tinea unguium: Secondary | ICD-10-CM | POA: Diagnosis not present

## 2019-06-18 DIAGNOSIS — L84 Corns and callosities: Secondary | ICD-10-CM | POA: Diagnosis not present

## 2019-06-18 DIAGNOSIS — E1142 Type 2 diabetes mellitus with diabetic polyneuropathy: Secondary | ICD-10-CM | POA: Diagnosis not present

## 2019-06-19 DIAGNOSIS — I10 Essential (primary) hypertension: Secondary | ICD-10-CM | POA: Diagnosis not present

## 2019-06-19 DIAGNOSIS — E785 Hyperlipidemia, unspecified: Secondary | ICD-10-CM | POA: Diagnosis not present

## 2019-06-19 DIAGNOSIS — J449 Chronic obstructive pulmonary disease, unspecified: Secondary | ICD-10-CM | POA: Diagnosis not present

## 2019-06-19 DIAGNOSIS — J441 Chronic obstructive pulmonary disease with (acute) exacerbation: Secondary | ICD-10-CM | POA: Diagnosis not present

## 2019-08-03 DIAGNOSIS — H401122 Primary open-angle glaucoma, left eye, moderate stage: Secondary | ICD-10-CM | POA: Diagnosis not present

## 2019-08-03 DIAGNOSIS — H401112 Primary open-angle glaucoma, right eye, moderate stage: Secondary | ICD-10-CM | POA: Diagnosis not present

## 2019-08-07 DIAGNOSIS — J449 Chronic obstructive pulmonary disease, unspecified: Secondary | ICD-10-CM | POA: Diagnosis not present

## 2019-08-07 DIAGNOSIS — J441 Chronic obstructive pulmonary disease with (acute) exacerbation: Secondary | ICD-10-CM | POA: Diagnosis not present

## 2019-08-07 DIAGNOSIS — I10 Essential (primary) hypertension: Secondary | ICD-10-CM | POA: Diagnosis not present

## 2019-08-07 DIAGNOSIS — E785 Hyperlipidemia, unspecified: Secondary | ICD-10-CM | POA: Diagnosis not present

## 2019-08-10 DIAGNOSIS — H401122 Primary open-angle glaucoma, left eye, moderate stage: Secondary | ICD-10-CM | POA: Diagnosis not present

## 2019-08-13 DIAGNOSIS — J449 Chronic obstructive pulmonary disease, unspecified: Secondary | ICD-10-CM | POA: Diagnosis not present

## 2019-08-13 DIAGNOSIS — L03213 Periorbital cellulitis: Secondary | ICD-10-CM | POA: Diagnosis not present

## 2019-08-13 DIAGNOSIS — I10 Essential (primary) hypertension: Secondary | ICD-10-CM | POA: Diagnosis not present

## 2019-08-13 DIAGNOSIS — H409 Unspecified glaucoma: Secondary | ICD-10-CM | POA: Diagnosis not present

## 2019-08-18 DIAGNOSIS — H401122 Primary open-angle glaucoma, left eye, moderate stage: Secondary | ICD-10-CM | POA: Diagnosis not present

## 2019-08-24 DIAGNOSIS — H401122 Primary open-angle glaucoma, left eye, moderate stage: Secondary | ICD-10-CM | POA: Diagnosis not present

## 2019-08-24 DIAGNOSIS — H409 Unspecified glaucoma: Secondary | ICD-10-CM | POA: Diagnosis not present

## 2019-08-24 DIAGNOSIS — H401112 Primary open-angle glaucoma, right eye, moderate stage: Secondary | ICD-10-CM | POA: Diagnosis not present

## 2019-08-27 DIAGNOSIS — L84 Corns and callosities: Secondary | ICD-10-CM | POA: Diagnosis not present

## 2019-08-27 DIAGNOSIS — M79676 Pain in unspecified toe(s): Secondary | ICD-10-CM | POA: Diagnosis not present

## 2019-08-27 DIAGNOSIS — E1142 Type 2 diabetes mellitus with diabetic polyneuropathy: Secondary | ICD-10-CM | POA: Diagnosis not present

## 2019-08-27 DIAGNOSIS — B351 Tinea unguium: Secondary | ICD-10-CM | POA: Diagnosis not present

## 2019-09-01 DIAGNOSIS — J449 Chronic obstructive pulmonary disease, unspecified: Secondary | ICD-10-CM | POA: Diagnosis not present

## 2019-09-01 DIAGNOSIS — E785 Hyperlipidemia, unspecified: Secondary | ICD-10-CM | POA: Diagnosis not present

## 2019-09-01 DIAGNOSIS — J441 Chronic obstructive pulmonary disease with (acute) exacerbation: Secondary | ICD-10-CM | POA: Diagnosis not present

## 2019-09-01 DIAGNOSIS — I10 Essential (primary) hypertension: Secondary | ICD-10-CM | POA: Diagnosis not present

## 2019-09-08 DIAGNOSIS — F339 Major depressive disorder, recurrent, unspecified: Secondary | ICD-10-CM | POA: Diagnosis not present

## 2019-09-08 DIAGNOSIS — J449 Chronic obstructive pulmonary disease, unspecified: Secondary | ICD-10-CM | POA: Diagnosis not present

## 2019-09-08 DIAGNOSIS — Z8546 Personal history of malignant neoplasm of prostate: Secondary | ICD-10-CM | POA: Diagnosis not present

## 2019-09-08 DIAGNOSIS — I10 Essential (primary) hypertension: Secondary | ICD-10-CM | POA: Diagnosis not present

## 2019-10-23 DIAGNOSIS — F17210 Nicotine dependence, cigarettes, uncomplicated: Secondary | ICD-10-CM | POA: Diagnosis not present

## 2019-10-23 DIAGNOSIS — R5383 Other fatigue: Secondary | ICD-10-CM | POA: Diagnosis not present

## 2019-10-23 DIAGNOSIS — E1151 Type 2 diabetes mellitus with diabetic peripheral angiopathy without gangrene: Secondary | ICD-10-CM | POA: Diagnosis not present

## 2019-10-23 DIAGNOSIS — Z79899 Other long term (current) drug therapy: Secondary | ICD-10-CM | POA: Diagnosis not present

## 2019-11-05 DIAGNOSIS — B351 Tinea unguium: Secondary | ICD-10-CM | POA: Diagnosis not present

## 2019-11-05 DIAGNOSIS — E1142 Type 2 diabetes mellitus with diabetic polyneuropathy: Secondary | ICD-10-CM | POA: Diagnosis not present

## 2019-11-05 DIAGNOSIS — L84 Corns and callosities: Secondary | ICD-10-CM | POA: Diagnosis not present

## 2019-11-05 DIAGNOSIS — M79676 Pain in unspecified toe(s): Secondary | ICD-10-CM | POA: Diagnosis not present

## 2019-11-13 DIAGNOSIS — I10 Essential (primary) hypertension: Secondary | ICD-10-CM | POA: Diagnosis not present

## 2019-11-13 DIAGNOSIS — J449 Chronic obstructive pulmonary disease, unspecified: Secondary | ICD-10-CM | POA: Diagnosis not present

## 2019-11-13 DIAGNOSIS — Z8546 Personal history of malignant neoplasm of prostate: Secondary | ICD-10-CM | POA: Diagnosis not present

## 2019-11-13 DIAGNOSIS — F339 Major depressive disorder, recurrent, unspecified: Secondary | ICD-10-CM | POA: Diagnosis not present

## 2020-01-14 DIAGNOSIS — B351 Tinea unguium: Secondary | ICD-10-CM | POA: Diagnosis not present

## 2020-01-14 DIAGNOSIS — M79676 Pain in unspecified toe(s): Secondary | ICD-10-CM | POA: Diagnosis not present

## 2020-01-14 DIAGNOSIS — L84 Corns and callosities: Secondary | ICD-10-CM | POA: Diagnosis not present

## 2020-01-14 DIAGNOSIS — E1142 Type 2 diabetes mellitus with diabetic polyneuropathy: Secondary | ICD-10-CM | POA: Diagnosis not present

## 2020-01-15 DIAGNOSIS — J449 Chronic obstructive pulmonary disease, unspecified: Secondary | ICD-10-CM | POA: Diagnosis not present

## 2020-01-15 DIAGNOSIS — F339 Major depressive disorder, recurrent, unspecified: Secondary | ICD-10-CM | POA: Diagnosis not present

## 2020-01-15 DIAGNOSIS — I10 Essential (primary) hypertension: Secondary | ICD-10-CM | POA: Diagnosis not present

## 2020-01-15 DIAGNOSIS — Z8546 Personal history of malignant neoplasm of prostate: Secondary | ICD-10-CM | POA: Diagnosis not present

## 2020-02-17 DIAGNOSIS — Z0000 Encounter for general adult medical examination without abnormal findings: Secondary | ICD-10-CM | POA: Diagnosis not present

## 2020-02-17 DIAGNOSIS — K219 Gastro-esophageal reflux disease without esophagitis: Secondary | ICD-10-CM | POA: Diagnosis not present

## 2020-02-17 DIAGNOSIS — F339 Major depressive disorder, recurrent, unspecified: Secondary | ICD-10-CM | POA: Diagnosis not present

## 2020-02-17 DIAGNOSIS — J449 Chronic obstructive pulmonary disease, unspecified: Secondary | ICD-10-CM | POA: Diagnosis not present

## 2020-02-17 DIAGNOSIS — I10 Essential (primary) hypertension: Secondary | ICD-10-CM | POA: Diagnosis not present

## 2020-02-17 DIAGNOSIS — Z8546 Personal history of malignant neoplasm of prostate: Secondary | ICD-10-CM | POA: Diagnosis not present

## 2020-02-17 DIAGNOSIS — Z23 Encounter for immunization: Secondary | ICD-10-CM | POA: Diagnosis not present

## 2020-03-07 DIAGNOSIS — H401132 Primary open-angle glaucoma, bilateral, moderate stage: Secondary | ICD-10-CM | POA: Diagnosis not present

## 2020-03-07 DIAGNOSIS — E113293 Type 2 diabetes mellitus with mild nonproliferative diabetic retinopathy without macular edema, bilateral: Secondary | ICD-10-CM | POA: Diagnosis not present

## 2020-03-24 DIAGNOSIS — B351 Tinea unguium: Secondary | ICD-10-CM | POA: Diagnosis not present

## 2020-03-24 DIAGNOSIS — E1142 Type 2 diabetes mellitus with diabetic polyneuropathy: Secondary | ICD-10-CM | POA: Diagnosis not present

## 2020-03-24 DIAGNOSIS — M79676 Pain in unspecified toe(s): Secondary | ICD-10-CM | POA: Diagnosis not present

## 2020-03-24 DIAGNOSIS — L84 Corns and callosities: Secondary | ICD-10-CM | POA: Diagnosis not present

## 2020-03-25 DIAGNOSIS — Z8546 Personal history of malignant neoplasm of prostate: Secondary | ICD-10-CM | POA: Diagnosis not present

## 2020-03-25 DIAGNOSIS — I10 Essential (primary) hypertension: Secondary | ICD-10-CM | POA: Diagnosis not present

## 2020-03-25 DIAGNOSIS — K219 Gastro-esophageal reflux disease without esophagitis: Secondary | ICD-10-CM | POA: Diagnosis not present

## 2020-03-25 DIAGNOSIS — J449 Chronic obstructive pulmonary disease, unspecified: Secondary | ICD-10-CM | POA: Diagnosis not present

## 2020-04-19 ENCOUNTER — Other Ambulatory Visit: Payer: Self-pay | Admitting: Internal Medicine

## 2020-04-19 ENCOUNTER — Ambulatory Visit
Admission: RE | Admit: 2020-04-19 | Discharge: 2020-04-19 | Disposition: A | Discharge status: Home / Self Care | Payer: Medicare Other | Source: Ambulatory Visit | Attending: Internal Medicine | Admitting: Internal Medicine

## 2020-04-19 DIAGNOSIS — D649 Anemia, unspecified: Secondary | ICD-10-CM | POA: Diagnosis not present

## 2020-04-19 DIAGNOSIS — J441 Chronic obstructive pulmonary disease with (acute) exacerbation: Secondary | ICD-10-CM

## 2020-04-19 DIAGNOSIS — J209 Acute bronchitis, unspecified: Secondary | ICD-10-CM | POA: Diagnosis not present

## 2020-04-19 DIAGNOSIS — F17200 Nicotine dependence, unspecified, uncomplicated: Secondary | ICD-10-CM | POA: Diagnosis not present

## 2020-05-06 DIAGNOSIS — I10 Essential (primary) hypertension: Secondary | ICD-10-CM | POA: Diagnosis not present

## 2020-05-06 DIAGNOSIS — E785 Hyperlipidemia, unspecified: Secondary | ICD-10-CM | POA: Diagnosis not present

## 2020-05-06 DIAGNOSIS — J449 Chronic obstructive pulmonary disease, unspecified: Secondary | ICD-10-CM | POA: Diagnosis not present

## 2020-05-06 DIAGNOSIS — E1151 Type 2 diabetes mellitus with diabetic peripheral angiopathy without gangrene: Secondary | ICD-10-CM | POA: Diagnosis not present

## 2020-06-03 DIAGNOSIS — I10 Essential (primary) hypertension: Secondary | ICD-10-CM | POA: Diagnosis not present

## 2020-06-03 DIAGNOSIS — E785 Hyperlipidemia, unspecified: Secondary | ICD-10-CM | POA: Diagnosis not present

## 2020-06-03 DIAGNOSIS — E1151 Type 2 diabetes mellitus with diabetic peripheral angiopathy without gangrene: Secondary | ICD-10-CM | POA: Diagnosis not present

## 2020-06-03 DIAGNOSIS — J449 Chronic obstructive pulmonary disease, unspecified: Secondary | ICD-10-CM | POA: Diagnosis not present

## 2020-06-06 DIAGNOSIS — H401132 Primary open-angle glaucoma, bilateral, moderate stage: Secondary | ICD-10-CM | POA: Diagnosis not present

## 2020-06-08 DIAGNOSIS — H524 Presbyopia: Secondary | ICD-10-CM | POA: Diagnosis not present

## 2020-06-09 DIAGNOSIS — B351 Tinea unguium: Secondary | ICD-10-CM | POA: Diagnosis not present

## 2020-06-09 DIAGNOSIS — M79676 Pain in unspecified toe(s): Secondary | ICD-10-CM | POA: Diagnosis not present

## 2020-06-09 DIAGNOSIS — E1142 Type 2 diabetes mellitus with diabetic polyneuropathy: Secondary | ICD-10-CM | POA: Diagnosis not present

## 2020-06-09 DIAGNOSIS — L84 Corns and callosities: Secondary | ICD-10-CM | POA: Diagnosis not present

## 2020-06-14 DIAGNOSIS — E781 Pure hyperglyceridemia: Secondary | ICD-10-CM | POA: Diagnosis not present

## 2020-06-14 DIAGNOSIS — J449 Chronic obstructive pulmonary disease, unspecified: Secondary | ICD-10-CM | POA: Diagnosis not present

## 2020-06-14 DIAGNOSIS — E1151 Type 2 diabetes mellitus with diabetic peripheral angiopathy without gangrene: Secondary | ICD-10-CM | POA: Diagnosis not present

## 2020-06-14 DIAGNOSIS — F17210 Nicotine dependence, cigarettes, uncomplicated: Secondary | ICD-10-CM | POA: Diagnosis not present

## 2020-07-04 DIAGNOSIS — H401132 Primary open-angle glaucoma, bilateral, moderate stage: Secondary | ICD-10-CM | POA: Diagnosis not present

## 2020-07-05 DIAGNOSIS — K219 Gastro-esophageal reflux disease without esophagitis: Secondary | ICD-10-CM | POA: Diagnosis not present

## 2020-07-05 DIAGNOSIS — I10 Essential (primary) hypertension: Secondary | ICD-10-CM | POA: Diagnosis not present

## 2020-07-05 DIAGNOSIS — Z8546 Personal history of malignant neoplasm of prostate: Secondary | ICD-10-CM | POA: Diagnosis not present

## 2020-07-05 DIAGNOSIS — J449 Chronic obstructive pulmonary disease, unspecified: Secondary | ICD-10-CM | POA: Diagnosis not present

## 2020-08-03 DIAGNOSIS — E1151 Type 2 diabetes mellitus with diabetic peripheral angiopathy without gangrene: Secondary | ICD-10-CM | POA: Diagnosis not present

## 2020-08-03 DIAGNOSIS — K219 Gastro-esophageal reflux disease without esophagitis: Secondary | ICD-10-CM | POA: Diagnosis not present

## 2020-08-03 DIAGNOSIS — E785 Hyperlipidemia, unspecified: Secondary | ICD-10-CM | POA: Diagnosis not present

## 2020-08-03 DIAGNOSIS — I10 Essential (primary) hypertension: Secondary | ICD-10-CM | POA: Diagnosis not present

## 2020-08-18 DIAGNOSIS — B351 Tinea unguium: Secondary | ICD-10-CM | POA: Diagnosis not present

## 2020-08-18 DIAGNOSIS — L84 Corns and callosities: Secondary | ICD-10-CM | POA: Diagnosis not present

## 2020-08-18 DIAGNOSIS — M79676 Pain in unspecified toe(s): Secondary | ICD-10-CM | POA: Diagnosis not present

## 2020-08-18 DIAGNOSIS — E1142 Type 2 diabetes mellitus with diabetic polyneuropathy: Secondary | ICD-10-CM | POA: Diagnosis not present

## 2020-08-23 DIAGNOSIS — K219 Gastro-esophageal reflux disease without esophagitis: Secondary | ICD-10-CM | POA: Diagnosis not present

## 2020-08-23 DIAGNOSIS — I10 Essential (primary) hypertension: Secondary | ICD-10-CM | POA: Diagnosis not present

## 2020-08-23 DIAGNOSIS — J449 Chronic obstructive pulmonary disease, unspecified: Secondary | ICD-10-CM | POA: Diagnosis not present

## 2020-08-23 DIAGNOSIS — Z8546 Personal history of malignant neoplasm of prostate: Secondary | ICD-10-CM | POA: Diagnosis not present

## 2020-10-06 DIAGNOSIS — K219 Gastro-esophageal reflux disease without esophagitis: Secondary | ICD-10-CM | POA: Diagnosis not present

## 2020-10-06 DIAGNOSIS — Z8546 Personal history of malignant neoplasm of prostate: Secondary | ICD-10-CM | POA: Diagnosis not present

## 2020-10-06 DIAGNOSIS — J449 Chronic obstructive pulmonary disease, unspecified: Secondary | ICD-10-CM | POA: Diagnosis not present

## 2020-10-06 DIAGNOSIS — I10 Essential (primary) hypertension: Secondary | ICD-10-CM | POA: Diagnosis not present

## 2020-10-18 DIAGNOSIS — E1151 Type 2 diabetes mellitus with diabetic peripheral angiopathy without gangrene: Secondary | ICD-10-CM | POA: Diagnosis not present

## 2020-10-18 DIAGNOSIS — J449 Chronic obstructive pulmonary disease, unspecified: Secondary | ICD-10-CM | POA: Diagnosis not present

## 2020-10-18 DIAGNOSIS — Z7984 Long term (current) use of oral hypoglycemic drugs: Secondary | ICD-10-CM | POA: Diagnosis not present

## 2020-10-31 DIAGNOSIS — H401132 Primary open-angle glaucoma, bilateral, moderate stage: Secondary | ICD-10-CM | POA: Diagnosis not present

## 2020-11-03 DIAGNOSIS — L84 Corns and callosities: Secondary | ICD-10-CM | POA: Diagnosis not present

## 2020-11-03 DIAGNOSIS — E1142 Type 2 diabetes mellitus with diabetic polyneuropathy: Secondary | ICD-10-CM | POA: Diagnosis not present

## 2020-11-03 DIAGNOSIS — B351 Tinea unguium: Secondary | ICD-10-CM | POA: Diagnosis not present

## 2020-11-03 DIAGNOSIS — M79676 Pain in unspecified toe(s): Secondary | ICD-10-CM | POA: Diagnosis not present

## 2020-11-20 DIAGNOSIS — I10 Essential (primary) hypertension: Secondary | ICD-10-CM | POA: Diagnosis not present

## 2020-11-20 DIAGNOSIS — E1151 Type 2 diabetes mellitus with diabetic peripheral angiopathy without gangrene: Secondary | ICD-10-CM | POA: Diagnosis not present

## 2020-11-20 DIAGNOSIS — E785 Hyperlipidemia, unspecified: Secondary | ICD-10-CM | POA: Diagnosis not present

## 2020-11-20 DIAGNOSIS — J449 Chronic obstructive pulmonary disease, unspecified: Secondary | ICD-10-CM | POA: Diagnosis not present

## 2021-01-12 DIAGNOSIS — M79676 Pain in unspecified toe(s): Secondary | ICD-10-CM | POA: Diagnosis not present

## 2021-01-12 DIAGNOSIS — E1142 Type 2 diabetes mellitus with diabetic polyneuropathy: Secondary | ICD-10-CM | POA: Diagnosis not present

## 2021-01-12 DIAGNOSIS — B351 Tinea unguium: Secondary | ICD-10-CM | POA: Diagnosis not present

## 2021-01-12 DIAGNOSIS — L84 Corns and callosities: Secondary | ICD-10-CM | POA: Diagnosis not present

## 2021-02-24 DIAGNOSIS — E1151 Type 2 diabetes mellitus with diabetic peripheral angiopathy without gangrene: Secondary | ICD-10-CM | POA: Diagnosis not present

## 2021-02-24 DIAGNOSIS — F339 Major depressive disorder, recurrent, unspecified: Secondary | ICD-10-CM | POA: Diagnosis not present

## 2021-02-24 DIAGNOSIS — Z0000 Encounter for general adult medical examination without abnormal findings: Secondary | ICD-10-CM | POA: Diagnosis not present

## 2021-02-24 DIAGNOSIS — J449 Chronic obstructive pulmonary disease, unspecified: Secondary | ICD-10-CM | POA: Diagnosis not present

## 2021-02-24 DIAGNOSIS — K219 Gastro-esophageal reflux disease without esophagitis: Secondary | ICD-10-CM | POA: Diagnosis not present

## 2021-02-24 DIAGNOSIS — I10 Essential (primary) hypertension: Secondary | ICD-10-CM | POA: Diagnosis not present

## 2021-02-24 DIAGNOSIS — E785 Hyperlipidemia, unspecified: Secondary | ICD-10-CM | POA: Diagnosis not present

## 2021-02-24 DIAGNOSIS — Z7984 Long term (current) use of oral hypoglycemic drugs: Secondary | ICD-10-CM | POA: Diagnosis not present

## 2021-02-27 DIAGNOSIS — K219 Gastro-esophageal reflux disease without esophagitis: Secondary | ICD-10-CM | POA: Diagnosis not present

## 2021-02-27 DIAGNOSIS — E781 Pure hyperglyceridemia: Secondary | ICD-10-CM | POA: Diagnosis not present

## 2021-02-27 DIAGNOSIS — I10 Essential (primary) hypertension: Secondary | ICD-10-CM | POA: Diagnosis not present

## 2021-02-27 DIAGNOSIS — E1151 Type 2 diabetes mellitus with diabetic peripheral angiopathy without gangrene: Secondary | ICD-10-CM | POA: Diagnosis not present

## 2021-03-06 DIAGNOSIS — H401132 Primary open-angle glaucoma, bilateral, moderate stage: Secondary | ICD-10-CM | POA: Diagnosis not present

## 2021-04-17 IMAGING — CR DG CHEST 2V
2 series · 2 of 2 positions shown · non-contrast
Comparison: 07/21/2015

CLINICAL DATA: COPD exacerbation for 3 days

History of prostate cancer, diabetes, hypertension
EXAM:
CHEST - 2 VIEW

[w chest pa]
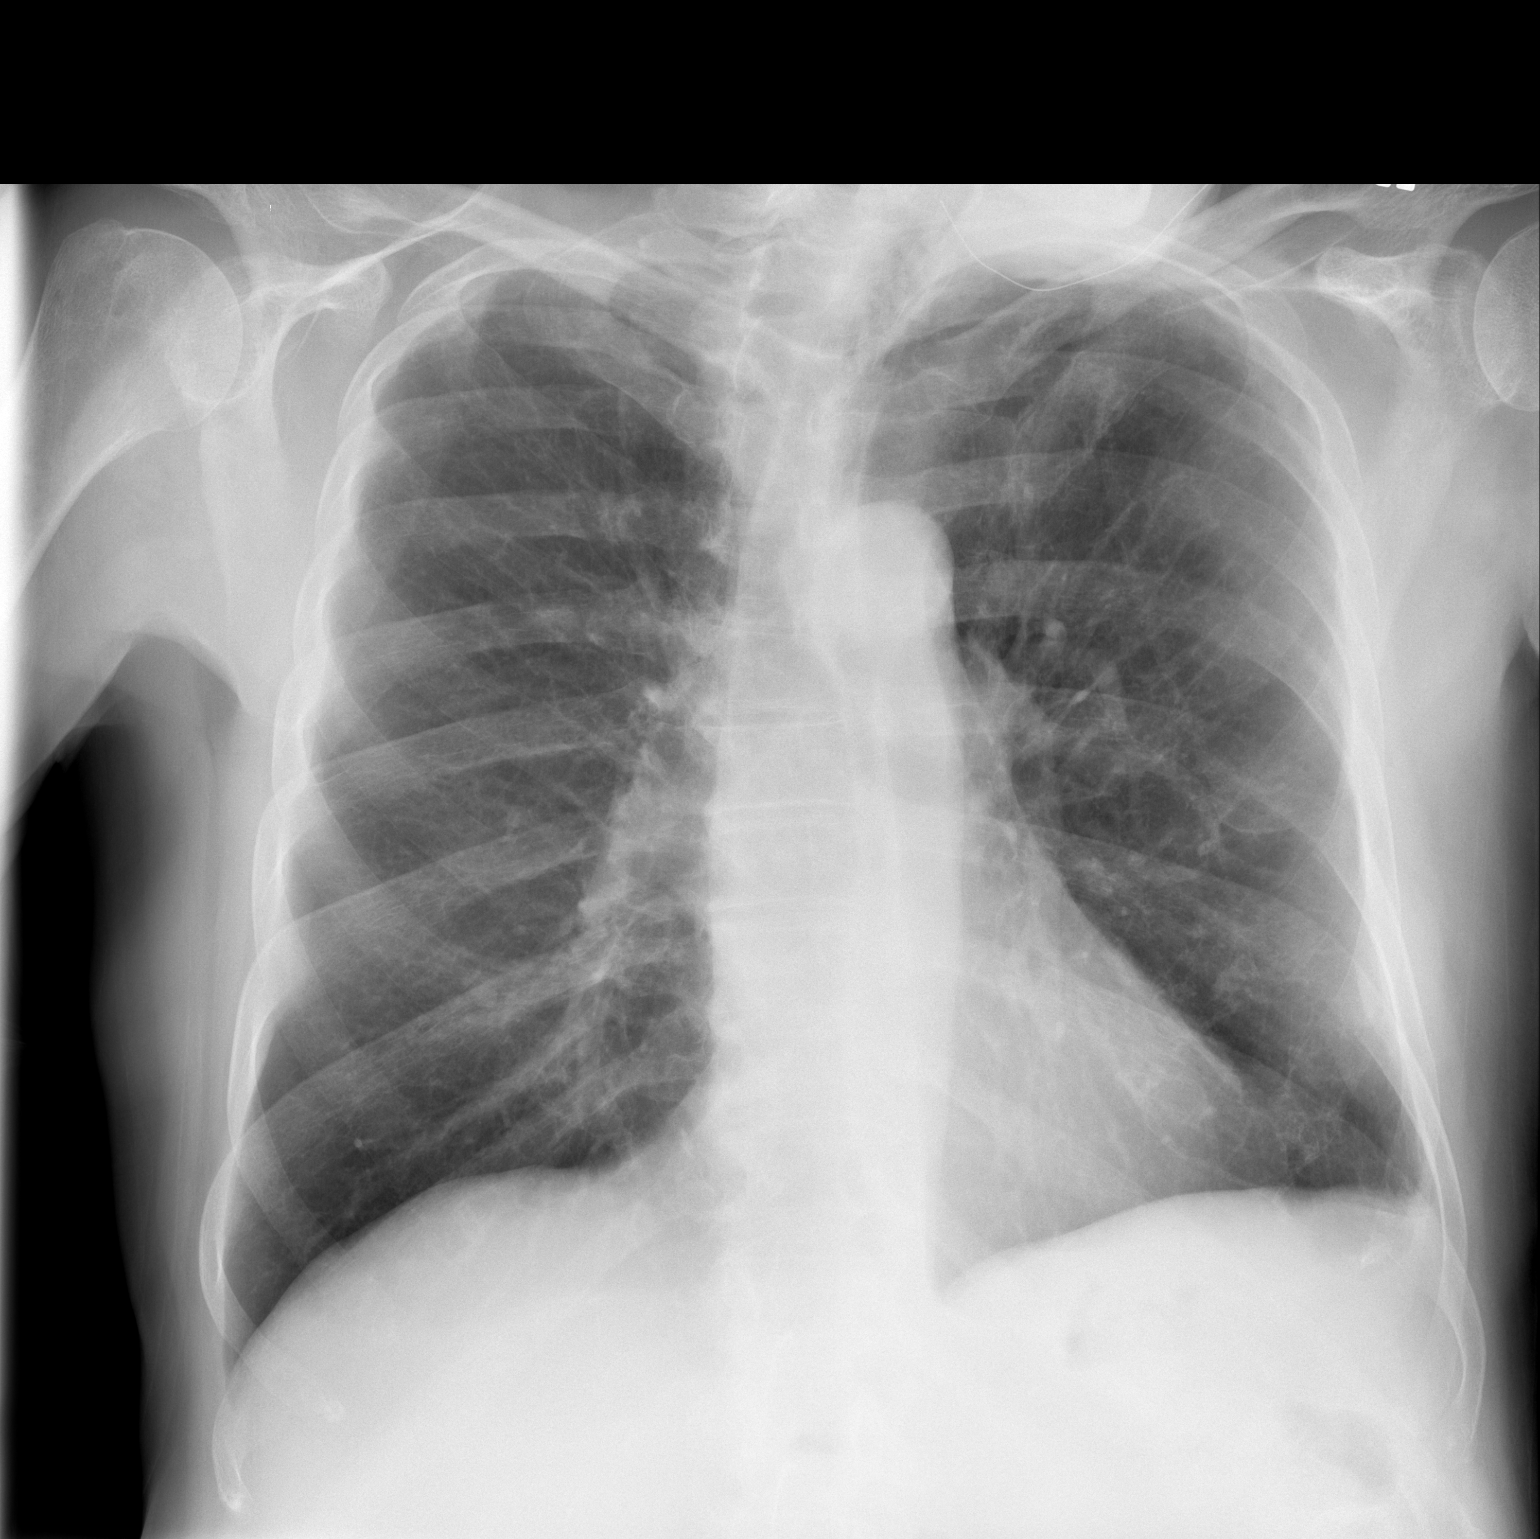

[w chest lat]
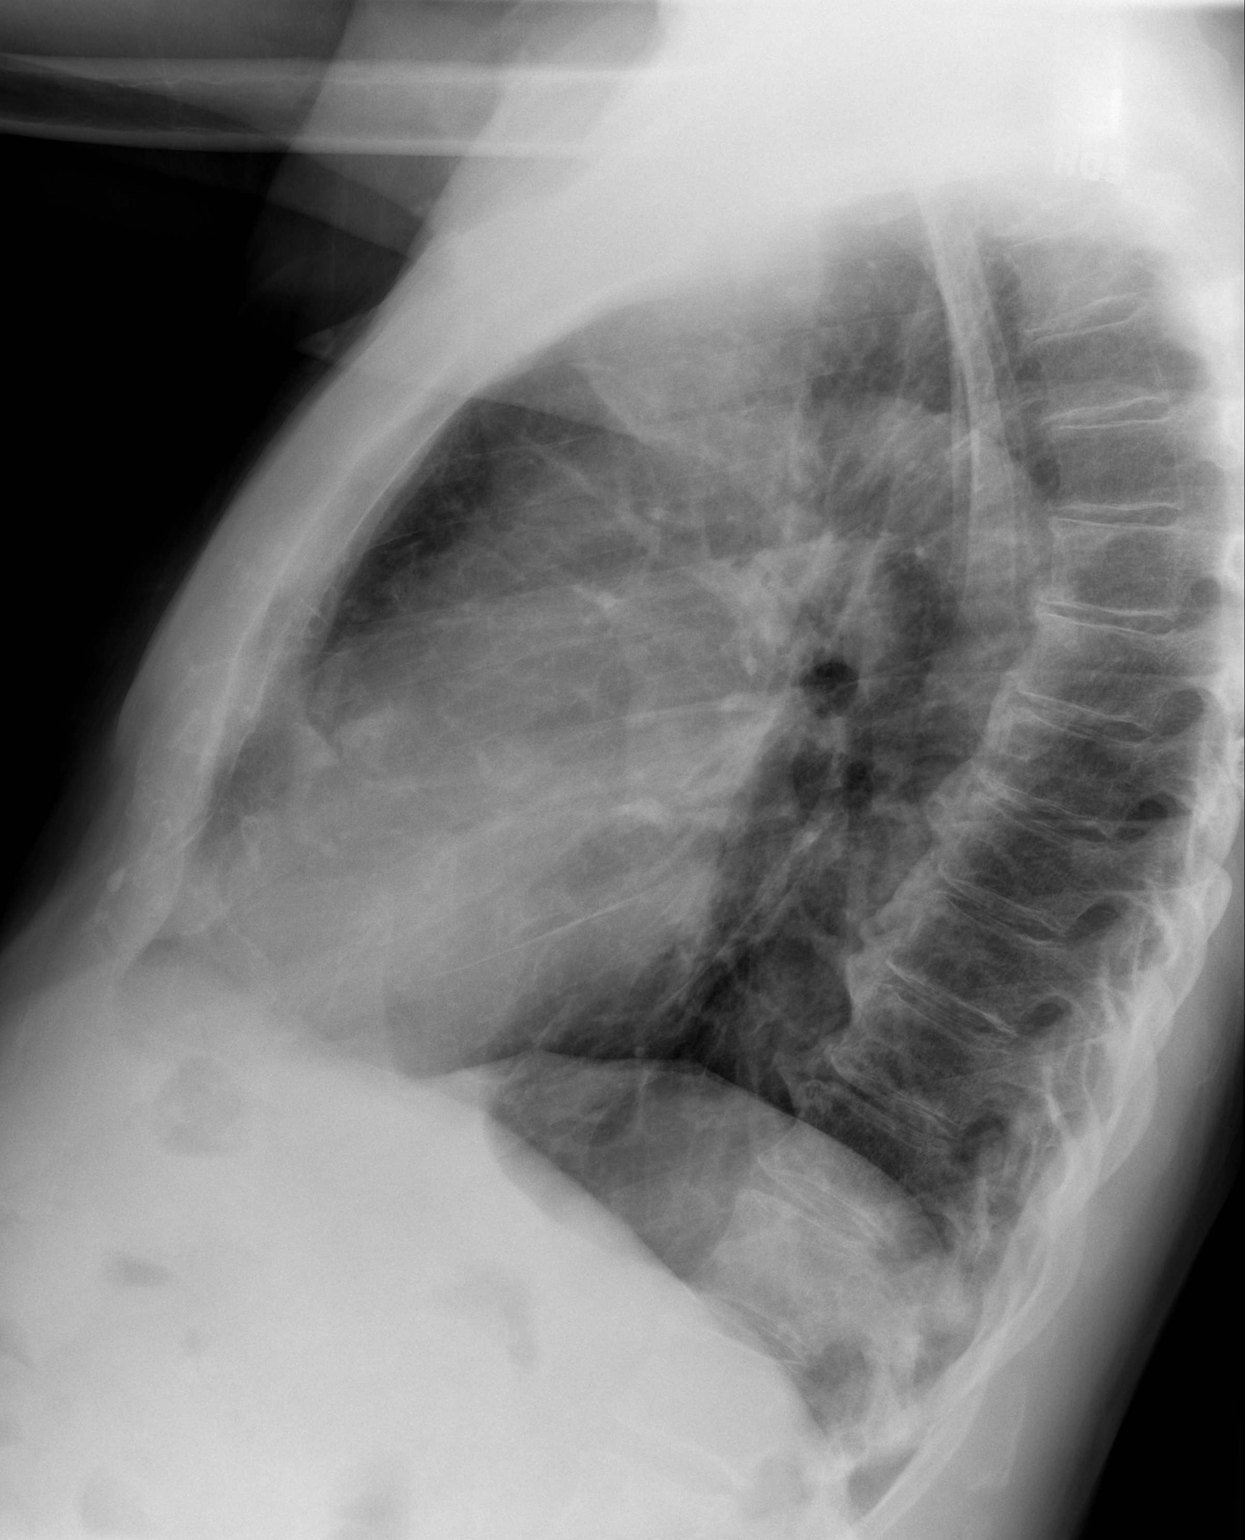

[2 of 2 positions shown; findings below may reference images not displayed]

FINDINGS: Cardiomediastinal silhouette and pulmonary vasculature are within
normal limits. The lungs are unchanged mild elevation of the left
hemidiaphragm with blunted costophrenic angle, likely due to
scarring. Lungs are clear.
IMPRESSION: No acute cardiopulmonary process.

## 2021-05-03 DIAGNOSIS — F339 Major depressive disorder, recurrent, unspecified: Secondary | ICD-10-CM | POA: Diagnosis not present

## 2021-05-03 DIAGNOSIS — E781 Pure hyperglyceridemia: Secondary | ICD-10-CM | POA: Diagnosis not present

## 2021-05-03 DIAGNOSIS — E1151 Type 2 diabetes mellitus with diabetic peripheral angiopathy without gangrene: Secondary | ICD-10-CM | POA: Diagnosis not present

## 2021-05-03 DIAGNOSIS — I10 Essential (primary) hypertension: Secondary | ICD-10-CM | POA: Diagnosis not present

## 2021-06-26 DIAGNOSIS — H401132 Primary open-angle glaucoma, bilateral, moderate stage: Secondary | ICD-10-CM | POA: Diagnosis not present

## 2021-08-03 DIAGNOSIS — Z460 Encounter for fitting and adjustment of spectacles and contact lenses: Secondary | ICD-10-CM | POA: Diagnosis not present

## 2021-08-25 DIAGNOSIS — I10 Essential (primary) hypertension: Secondary | ICD-10-CM | POA: Diagnosis not present

## 2021-08-25 DIAGNOSIS — H6504 Acute serous otitis media, recurrent, right ear: Secondary | ICD-10-CM | POA: Diagnosis not present

## 2021-08-25 DIAGNOSIS — J449 Chronic obstructive pulmonary disease, unspecified: Secondary | ICD-10-CM | POA: Diagnosis not present

## 2021-08-25 DIAGNOSIS — E1151 Type 2 diabetes mellitus with diabetic peripheral angiopathy without gangrene: Secondary | ICD-10-CM | POA: Diagnosis not present

## 2021-09-25 DIAGNOSIS — E781 Pure hyperglyceridemia: Secondary | ICD-10-CM | POA: Diagnosis not present

## 2021-09-25 DIAGNOSIS — G8929 Other chronic pain: Secondary | ICD-10-CM | POA: Diagnosis not present

## 2021-09-25 DIAGNOSIS — E1151 Type 2 diabetes mellitus with diabetic peripheral angiopathy without gangrene: Secondary | ICD-10-CM | POA: Diagnosis not present

## 2021-09-25 DIAGNOSIS — I10 Essential (primary) hypertension: Secondary | ICD-10-CM | POA: Diagnosis not present

## 2021-10-02 DIAGNOSIS — H401132 Primary open-angle glaucoma, bilateral, moderate stage: Secondary | ICD-10-CM | POA: Diagnosis not present

## 2021-10-02 DIAGNOSIS — H348122 Central retinal vein occlusion, left eye, stable: Secondary | ICD-10-CM | POA: Diagnosis not present

## 2021-10-16 DIAGNOSIS — H34812 Central retinal vein occlusion, left eye, with macular edema: Secondary | ICD-10-CM | POA: Diagnosis not present

## 2021-11-27 DIAGNOSIS — J441 Chronic obstructive pulmonary disease with (acute) exacerbation: Secondary | ICD-10-CM | POA: Diagnosis not present

## 2021-11-27 DIAGNOSIS — F17200 Nicotine dependence, unspecified, uncomplicated: Secondary | ICD-10-CM | POA: Diagnosis not present

## 2021-11-27 DIAGNOSIS — R634 Abnormal weight loss: Secondary | ICD-10-CM | POA: Diagnosis not present

## 2021-11-27 DIAGNOSIS — E1151 Type 2 diabetes mellitus with diabetic peripheral angiopathy without gangrene: Secondary | ICD-10-CM | POA: Diagnosis not present

## 2021-11-28 ENCOUNTER — Other Ambulatory Visit: Payer: Self-pay | Admitting: Internal Medicine

## 2021-11-28 DIAGNOSIS — R918 Other nonspecific abnormal finding of lung field: Secondary | ICD-10-CM

## 2021-12-14 ENCOUNTER — Other Ambulatory Visit: Payer: Self-pay | Admitting: Internal Medicine

## 2021-12-14 DIAGNOSIS — R918 Other nonspecific abnormal finding of lung field: Secondary | ICD-10-CM

## 2021-12-20 ENCOUNTER — Other Ambulatory Visit: Payer: Medicare Other

## 2021-12-26 ENCOUNTER — Other Ambulatory Visit: Payer: Medicare Other

## 2022-01-01 ENCOUNTER — Other Ambulatory Visit: Payer: Medicare Other

## 2022-01-08 ENCOUNTER — Ambulatory Visit
Admission: RE | Admit: 2022-01-08 | Discharge: 2022-01-08 | Disposition: A | Discharge status: Home / Self Care | Payer: Medicare Other | Source: Ambulatory Visit | Attending: Internal Medicine | Admitting: Internal Medicine

## 2022-01-08 DIAGNOSIS — J439 Emphysema, unspecified: Secondary | ICD-10-CM | POA: Diagnosis not present

## 2022-01-08 DIAGNOSIS — R0602 Shortness of breath: Secondary | ICD-10-CM | POA: Diagnosis not present

## 2022-01-08 DIAGNOSIS — R918 Other nonspecific abnormal finding of lung field: Secondary | ICD-10-CM

## 2022-01-08 DIAGNOSIS — R911 Solitary pulmonary nodule: Secondary | ICD-10-CM | POA: Diagnosis not present

## 2022-01-08 DIAGNOSIS — I700 Atherosclerosis of aorta: Secondary | ICD-10-CM | POA: Diagnosis not present

## 2022-02-19 DIAGNOSIS — H401132 Primary open-angle glaucoma, bilateral, moderate stage: Secondary | ICD-10-CM | POA: Diagnosis not present

## 2022-02-19 DIAGNOSIS — H34812 Central retinal vein occlusion, left eye, with macular edema: Secondary | ICD-10-CM | POA: Diagnosis not present

## 2022-03-06 DIAGNOSIS — Z0000 Encounter for general adult medical examination without abnormal findings: Secondary | ICD-10-CM | POA: Diagnosis not present

## 2022-03-06 DIAGNOSIS — E1151 Type 2 diabetes mellitus with diabetic peripheral angiopathy without gangrene: Secondary | ICD-10-CM | POA: Diagnosis not present

## 2022-03-06 DIAGNOSIS — F17200 Nicotine dependence, unspecified, uncomplicated: Secondary | ICD-10-CM | POA: Diagnosis not present

## 2022-03-06 DIAGNOSIS — I10 Essential (primary) hypertension: Secondary | ICD-10-CM | POA: Diagnosis not present

## 2022-03-06 DIAGNOSIS — E785 Hyperlipidemia, unspecified: Secondary | ICD-10-CM | POA: Diagnosis not present

## 2022-03-06 DIAGNOSIS — F339 Major depressive disorder, recurrent, unspecified: Secondary | ICD-10-CM | POA: Diagnosis not present

## 2022-03-06 DIAGNOSIS — Z1331 Encounter for screening for depression: Secondary | ICD-10-CM | POA: Diagnosis not present

## 2022-03-08 DIAGNOSIS — B351 Tinea unguium: Secondary | ICD-10-CM | POA: Diagnosis not present

## 2022-03-08 DIAGNOSIS — E1142 Type 2 diabetes mellitus with diabetic polyneuropathy: Secondary | ICD-10-CM | POA: Diagnosis not present

## 2022-03-08 DIAGNOSIS — L84 Corns and callosities: Secondary | ICD-10-CM | POA: Diagnosis not present

## 2022-03-08 DIAGNOSIS — M79676 Pain in unspecified toe(s): Secondary | ICD-10-CM | POA: Diagnosis not present

## 2022-05-24 DIAGNOSIS — E1142 Type 2 diabetes mellitus with diabetic polyneuropathy: Secondary | ICD-10-CM | POA: Diagnosis not present

## 2022-05-24 DIAGNOSIS — B351 Tinea unguium: Secondary | ICD-10-CM | POA: Diagnosis not present

## 2022-05-24 DIAGNOSIS — L84 Corns and callosities: Secondary | ICD-10-CM | POA: Diagnosis not present

## 2022-05-24 DIAGNOSIS — M79676 Pain in unspecified toe(s): Secondary | ICD-10-CM | POA: Diagnosis not present

## 2022-06-06 DIAGNOSIS — E1151 Type 2 diabetes mellitus with diabetic peripheral angiopathy without gangrene: Secondary | ICD-10-CM | POA: Diagnosis not present

## 2022-06-06 DIAGNOSIS — I10 Essential (primary) hypertension: Secondary | ICD-10-CM | POA: Diagnosis not present

## 2022-06-06 DIAGNOSIS — F3341 Major depressive disorder, recurrent, in partial remission: Secondary | ICD-10-CM | POA: Diagnosis not present

## 2022-06-06 DIAGNOSIS — E785 Hyperlipidemia, unspecified: Secondary | ICD-10-CM | POA: Diagnosis not present

## 2022-06-18 DIAGNOSIS — H401132 Primary open-angle glaucoma, bilateral, moderate stage: Secondary | ICD-10-CM | POA: Diagnosis not present

## 2022-06-22 DIAGNOSIS — E1151 Type 2 diabetes mellitus with diabetic peripheral angiopathy without gangrene: Secondary | ICD-10-CM | POA: Diagnosis not present

## 2022-06-22 DIAGNOSIS — K219 Gastro-esophageal reflux disease without esophagitis: Secondary | ICD-10-CM | POA: Diagnosis not present

## 2022-06-22 DIAGNOSIS — I10 Essential (primary) hypertension: Secondary | ICD-10-CM | POA: Diagnosis not present

## 2022-06-22 DIAGNOSIS — E785 Hyperlipidemia, unspecified: Secondary | ICD-10-CM | POA: Diagnosis not present

## 2022-08-02 DIAGNOSIS — E1142 Type 2 diabetes mellitus with diabetic polyneuropathy: Secondary | ICD-10-CM | POA: Diagnosis not present

## 2022-08-02 DIAGNOSIS — M79676 Pain in unspecified toe(s): Secondary | ICD-10-CM | POA: Diagnosis not present

## 2022-08-02 DIAGNOSIS — B351 Tinea unguium: Secondary | ICD-10-CM | POA: Diagnosis not present

## 2022-08-02 DIAGNOSIS — L84 Corns and callosities: Secondary | ICD-10-CM | POA: Diagnosis not present

## 2022-08-14 DIAGNOSIS — B351 Tinea unguium: Secondary | ICD-10-CM | POA: Diagnosis not present

## 2022-08-14 DIAGNOSIS — M79676 Pain in unspecified toe(s): Secondary | ICD-10-CM | POA: Diagnosis not present

## 2022-08-14 DIAGNOSIS — L84 Corns and callosities: Secondary | ICD-10-CM | POA: Diagnosis not present

## 2022-08-14 DIAGNOSIS — L97511 Non-pressure chronic ulcer of other part of right foot limited to breakdown of skin: Secondary | ICD-10-CM | POA: Diagnosis not present

## 2022-09-21 DIAGNOSIS — E1151 Type 2 diabetes mellitus with diabetic peripheral angiopathy without gangrene: Secondary | ICD-10-CM | POA: Diagnosis not present

## 2022-09-21 DIAGNOSIS — J449 Chronic obstructive pulmonary disease, unspecified: Secondary | ICD-10-CM | POA: Diagnosis not present

## 2022-09-21 DIAGNOSIS — E1165 Type 2 diabetes mellitus with hyperglycemia: Secondary | ICD-10-CM | POA: Diagnosis not present

## 2022-09-21 DIAGNOSIS — E1122 Type 2 diabetes mellitus with diabetic chronic kidney disease: Secondary | ICD-10-CM | POA: Diagnosis not present

## 2022-09-21 DIAGNOSIS — Z0000 Encounter for general adult medical examination without abnormal findings: Secondary | ICD-10-CM | POA: Diagnosis not present

## 2022-09-21 DIAGNOSIS — E1169 Type 2 diabetes mellitus with other specified complication: Secondary | ICD-10-CM | POA: Diagnosis not present

## 2022-09-21 DIAGNOSIS — E785 Hyperlipidemia, unspecified: Secondary | ICD-10-CM | POA: Diagnosis not present

## 2022-10-04 DIAGNOSIS — L84 Corns and callosities: Secondary | ICD-10-CM | POA: Diagnosis not present

## 2022-10-04 DIAGNOSIS — B351 Tinea unguium: Secondary | ICD-10-CM | POA: Diagnosis not present

## 2022-10-04 DIAGNOSIS — E1142 Type 2 diabetes mellitus with diabetic polyneuropathy: Secondary | ICD-10-CM | POA: Diagnosis not present

## 2022-10-04 DIAGNOSIS — M79676 Pain in unspecified toe(s): Secondary | ICD-10-CM | POA: Diagnosis not present

## 2022-10-22 DIAGNOSIS — H401132 Primary open-angle glaucoma, bilateral, moderate stage: Secondary | ICD-10-CM | POA: Diagnosis not present

## 2022-11-01 DIAGNOSIS — B351 Tinea unguium: Secondary | ICD-10-CM | POA: Diagnosis not present

## 2022-11-01 DIAGNOSIS — L84 Corns and callosities: Secondary | ICD-10-CM | POA: Diagnosis not present

## 2022-11-01 DIAGNOSIS — M79676 Pain in unspecified toe(s): Secondary | ICD-10-CM | POA: Diagnosis not present

## 2022-11-01 DIAGNOSIS — E1142 Type 2 diabetes mellitus with diabetic polyneuropathy: Secondary | ICD-10-CM | POA: Diagnosis not present

## 2022-12-27 ENCOUNTER — Other Ambulatory Visit: Payer: Self-pay | Admitting: Internal Medicine

## 2022-12-27 DIAGNOSIS — R911 Solitary pulmonary nodule: Secondary | ICD-10-CM

## 2023-01-10 DIAGNOSIS — B351 Tinea unguium: Secondary | ICD-10-CM | POA: Diagnosis not present

## 2023-01-10 DIAGNOSIS — M79676 Pain in unspecified toe(s): Secondary | ICD-10-CM | POA: Diagnosis not present

## 2023-01-10 DIAGNOSIS — L84 Corns and callosities: Secondary | ICD-10-CM | POA: Diagnosis not present

## 2023-01-10 DIAGNOSIS — E1142 Type 2 diabetes mellitus with diabetic polyneuropathy: Secondary | ICD-10-CM | POA: Diagnosis not present

## 2023-02-01 ENCOUNTER — Ambulatory Visit
Admission: RE | Admit: 2023-02-01 | Discharge: 2023-02-01 | Disposition: A | Discharge status: Home / Self Care | Payer: Medicare Other | Source: Ambulatory Visit | Attending: Internal Medicine | Admitting: Internal Medicine

## 2023-02-01 DIAGNOSIS — R918 Other nonspecific abnormal finding of lung field: Secondary | ICD-10-CM | POA: Diagnosis not present

## 2023-02-01 DIAGNOSIS — I2510 Atherosclerotic heart disease of native coronary artery without angina pectoris: Secondary | ICD-10-CM | POA: Diagnosis not present

## 2023-02-01 DIAGNOSIS — I7121 Aneurysm of the ascending aorta, without rupture: Secondary | ICD-10-CM | POA: Diagnosis not present

## 2023-02-01 DIAGNOSIS — D3502 Benign neoplasm of left adrenal gland: Secondary | ICD-10-CM | POA: Diagnosis not present

## 2023-02-01 DIAGNOSIS — R911 Solitary pulmonary nodule: Secondary | ICD-10-CM

## 2023-02-04 DIAGNOSIS — H1820 Unspecified corneal edema: Secondary | ICD-10-CM | POA: Diagnosis not present

## 2023-03-14 DIAGNOSIS — M79676 Pain in unspecified toe(s): Secondary | ICD-10-CM | POA: Diagnosis not present

## 2023-03-14 DIAGNOSIS — B351 Tinea unguium: Secondary | ICD-10-CM | POA: Diagnosis not present

## 2023-03-14 DIAGNOSIS — L84 Corns and callosities: Secondary | ICD-10-CM | POA: Diagnosis not present

## 2023-03-14 DIAGNOSIS — E1142 Type 2 diabetes mellitus with diabetic polyneuropathy: Secondary | ICD-10-CM | POA: Diagnosis not present

## 2023-03-18 DIAGNOSIS — Z0000 Encounter for general adult medical examination without abnormal findings: Secondary | ICD-10-CM | POA: Diagnosis not present

## 2023-03-18 DIAGNOSIS — J4489 Other specified chronic obstructive pulmonary disease: Secondary | ICD-10-CM | POA: Diagnosis not present

## 2023-03-18 DIAGNOSIS — I10 Essential (primary) hypertension: Secondary | ICD-10-CM | POA: Diagnosis not present

## 2023-03-18 DIAGNOSIS — I739 Peripheral vascular disease, unspecified: Secondary | ICD-10-CM | POA: Diagnosis not present

## 2023-03-18 DIAGNOSIS — N1831 Chronic kidney disease, stage 3a: Secondary | ICD-10-CM | POA: Diagnosis not present

## 2023-03-18 DIAGNOSIS — E1151 Type 2 diabetes mellitus with diabetic peripheral angiopathy without gangrene: Secondary | ICD-10-CM | POA: Diagnosis not present

## 2023-03-18 DIAGNOSIS — E1122 Type 2 diabetes mellitus with diabetic chronic kidney disease: Secondary | ICD-10-CM | POA: Diagnosis not present

## 2023-03-18 DIAGNOSIS — Z716 Tobacco abuse counseling: Secondary | ICD-10-CM | POA: Diagnosis not present

## 2023-03-18 DIAGNOSIS — F17200 Nicotine dependence, unspecified, uncomplicated: Secondary | ICD-10-CM | POA: Diagnosis not present

## 2023-07-15 DIAGNOSIS — H401132 Primary open-angle glaucoma, bilateral, moderate stage: Secondary | ICD-10-CM | POA: Diagnosis not present

## 2023-08-01 DIAGNOSIS — L84 Corns and callosities: Secondary | ICD-10-CM | POA: Diagnosis not present

## 2023-08-01 DIAGNOSIS — M79675 Pain in left toe(s): Secondary | ICD-10-CM | POA: Diagnosis not present

## 2023-08-01 DIAGNOSIS — B351 Tinea unguium: Secondary | ICD-10-CM | POA: Diagnosis not present

## 2023-08-01 DIAGNOSIS — E1142 Type 2 diabetes mellitus with diabetic polyneuropathy: Secondary | ICD-10-CM | POA: Diagnosis not present

## 2023-08-01 DIAGNOSIS — M79674 Pain in right toe(s): Secondary | ICD-10-CM | POA: Diagnosis not present

## 2023-09-27 DIAGNOSIS — F17210 Nicotine dependence, cigarettes, uncomplicated: Secondary | ICD-10-CM | POA: Diagnosis not present

## 2023-09-27 DIAGNOSIS — N3941 Urge incontinence: Secondary | ICD-10-CM | POA: Diagnosis not present

## 2023-09-27 DIAGNOSIS — Z0000 Encounter for general adult medical examination without abnormal findings: Secondary | ICD-10-CM | POA: Diagnosis not present

## 2023-09-27 DIAGNOSIS — F3341 Major depressive disorder, recurrent, in partial remission: Secondary | ICD-10-CM | POA: Diagnosis not present

## 2023-09-27 DIAGNOSIS — R059 Cough, unspecified: Secondary | ICD-10-CM | POA: Diagnosis not present

## 2023-09-27 DIAGNOSIS — N1831 Chronic kidney disease, stage 3a: Secondary | ICD-10-CM | POA: Diagnosis not present

## 2023-09-27 DIAGNOSIS — E1151 Type 2 diabetes mellitus with diabetic peripheral angiopathy without gangrene: Secondary | ICD-10-CM | POA: Diagnosis not present

## 2023-09-27 DIAGNOSIS — H401132 Primary open-angle glaucoma, bilateral, moderate stage: Secondary | ICD-10-CM | POA: Diagnosis not present

## 2023-09-27 DIAGNOSIS — I10 Essential (primary) hypertension: Secondary | ICD-10-CM | POA: Diagnosis not present

## 2023-09-27 DIAGNOSIS — Z716 Tobacco abuse counseling: Secondary | ICD-10-CM | POA: Diagnosis not present

## 2023-10-14 DIAGNOSIS — H401132 Primary open-angle glaucoma, bilateral, moderate stage: Secondary | ICD-10-CM | POA: Diagnosis not present

## 2023-10-14 DIAGNOSIS — H1820 Unspecified corneal edema: Secondary | ICD-10-CM | POA: Diagnosis not present

## 2023-10-17 DIAGNOSIS — E1142 Type 2 diabetes mellitus with diabetic polyneuropathy: Secondary | ICD-10-CM | POA: Diagnosis not present

## 2023-10-17 DIAGNOSIS — L84 Corns and callosities: Secondary | ICD-10-CM | POA: Diagnosis not present

## 2023-10-17 DIAGNOSIS — M79674 Pain in right toe(s): Secondary | ICD-10-CM | POA: Diagnosis not present

## 2023-10-17 DIAGNOSIS — M79675 Pain in left toe(s): Secondary | ICD-10-CM | POA: Diagnosis not present

## 2023-10-17 DIAGNOSIS — B351 Tinea unguium: Secondary | ICD-10-CM | POA: Diagnosis not present

## 2023-10-28 DIAGNOSIS — H409 Unspecified glaucoma: Secondary | ICD-10-CM | POA: Diagnosis not present

## 2023-10-28 DIAGNOSIS — H18231 Secondary corneal edema, right eye: Secondary | ICD-10-CM | POA: Diagnosis not present

## 2023-11-06 DIAGNOSIS — H401133 Primary open-angle glaucoma, bilateral, severe stage: Secondary | ICD-10-CM | POA: Diagnosis not present

## 2023-11-06 DIAGNOSIS — H34812 Central retinal vein occlusion, left eye, with macular edema: Secondary | ICD-10-CM | POA: Diagnosis not present

## 2023-11-06 DIAGNOSIS — H18231 Secondary corneal edema, right eye: Secondary | ICD-10-CM | POA: Diagnosis not present

## 2024-01-02 DIAGNOSIS — B351 Tinea unguium: Secondary | ICD-10-CM | POA: Diagnosis not present

## 2024-01-02 DIAGNOSIS — E1142 Type 2 diabetes mellitus with diabetic polyneuropathy: Secondary | ICD-10-CM | POA: Diagnosis not present

## 2024-01-02 DIAGNOSIS — L84 Corns and callosities: Secondary | ICD-10-CM | POA: Diagnosis not present

## 2024-01-02 DIAGNOSIS — M79675 Pain in left toe(s): Secondary | ICD-10-CM | POA: Diagnosis not present

## 2024-01-02 DIAGNOSIS — M79674 Pain in right toe(s): Secondary | ICD-10-CM | POA: Diagnosis not present

## 2024-01-27 DIAGNOSIS — I10 Essential (primary) hypertension: Secondary | ICD-10-CM | POA: Diagnosis not present

## 2024-01-27 DIAGNOSIS — N1831 Chronic kidney disease, stage 3a: Secondary | ICD-10-CM | POA: Diagnosis not present

## 2024-01-27 DIAGNOSIS — H409 Unspecified glaucoma: Secondary | ICD-10-CM | POA: Diagnosis not present

## 2024-01-27 DIAGNOSIS — E1165 Type 2 diabetes mellitus with hyperglycemia: Secondary | ICD-10-CM | POA: Diagnosis not present

## 2024-01-27 DIAGNOSIS — F3341 Major depressive disorder, recurrent, in partial remission: Secondary | ICD-10-CM | POA: Diagnosis not present

## 2024-01-27 DIAGNOSIS — F17200 Nicotine dependence, unspecified, uncomplicated: Secondary | ICD-10-CM | POA: Diagnosis not present

## 2024-02-05 DIAGNOSIS — H401133 Primary open-angle glaucoma, bilateral, severe stage: Secondary | ICD-10-CM | POA: Diagnosis not present

## 2024-02-05 DIAGNOSIS — H34812 Central retinal vein occlusion, left eye, with macular edema: Secondary | ICD-10-CM | POA: Diagnosis not present

## 2024-02-05 DIAGNOSIS — H18231 Secondary corneal edema, right eye: Secondary | ICD-10-CM | POA: Diagnosis not present

## 2024-03-19 DIAGNOSIS — L84 Corns and callosities: Secondary | ICD-10-CM | POA: Diagnosis not present

## 2024-03-19 DIAGNOSIS — M79675 Pain in left toe(s): Secondary | ICD-10-CM | POA: Diagnosis not present

## 2024-03-19 DIAGNOSIS — E1142 Type 2 diabetes mellitus with diabetic polyneuropathy: Secondary | ICD-10-CM | POA: Diagnosis not present

## 2024-03-19 DIAGNOSIS — B351 Tinea unguium: Secondary | ICD-10-CM | POA: Diagnosis not present

## 2024-03-19 DIAGNOSIS — M79674 Pain in right toe(s): Secondary | ICD-10-CM | POA: Diagnosis not present
